# Patient Record
Sex: Female | Born: 1960 | Race: White | Hispanic: No | Marital: Married | Smoking: Former smoker
Health system: Southern US, Community
[De-identification: ages and names within clinical notes are randomized; demographics above are authoritative.]

## PROBLEM LIST (undated history)

## (undated) DIAGNOSIS — T7840XA Allergy, unspecified, initial encounter: Secondary | ICD-10-CM

## (undated) DIAGNOSIS — M1990 Unspecified osteoarthritis, unspecified site: Secondary | ICD-10-CM

## (undated) DIAGNOSIS — R011 Cardiac murmur, unspecified: Secondary | ICD-10-CM

## (undated) DIAGNOSIS — I10 Essential (primary) hypertension: Secondary | ICD-10-CM

## (undated) DIAGNOSIS — H269 Unspecified cataract: Secondary | ICD-10-CM

## (undated) DIAGNOSIS — J45909 Unspecified asthma, uncomplicated: Secondary | ICD-10-CM

## (undated) HISTORY — PX: TUBAL LIGATION: SHX77

## (undated) HISTORY — DX: Allergy, unspecified, initial encounter: T78.40XA

## (undated) HISTORY — DX: Essential (primary) hypertension: I10

## (undated) HISTORY — DX: Unspecified osteoarthritis, unspecified site: M19.90

## (undated) HISTORY — DX: Unspecified cataract: H26.9

## (undated) HISTORY — DX: Unspecified asthma, uncomplicated: J45.909

## (undated) HISTORY — DX: Cardiac murmur, unspecified: R01.1

---

## 2005-06-25 ENCOUNTER — Ambulatory Visit: Payer: Self-pay | Admitting: Family Medicine

## 2006-11-24 ENCOUNTER — Ambulatory Visit: Payer: Self-pay | Admitting: Family Medicine

## 2007-12-28 ENCOUNTER — Ambulatory Visit: Payer: Self-pay | Admitting: Family Medicine

## 2008-09-03 ENCOUNTER — Encounter: Payer: Self-pay | Admitting: Family Medicine

## 2008-09-05 ENCOUNTER — Encounter: Payer: Self-pay | Admitting: Family Medicine

## 2010-04-17 ENCOUNTER — Ambulatory Visit: Payer: Self-pay | Admitting: Family Medicine

## 2010-12-03 ENCOUNTER — Emergency Department: Payer: Self-pay | Admitting: Emergency Medicine

## 2012-09-11 ENCOUNTER — Ambulatory Visit: Payer: Self-pay | Admitting: Family Medicine

## 2012-09-11 LAB — CBC AND DIFFERENTIAL
HCT: 39 % (ref 36–46)
Hemoglobin: 12.9 g/dL (ref 12.0–16.0)
Neutrophils Absolute: 8 /uL
PLATELETS: 368 10*3/uL (ref 150–399)
WBC: 11.6 10*3/mL

## 2015-03-03 ENCOUNTER — Ambulatory Visit: Payer: Self-pay | Admitting: Family Medicine

## 2015-07-07 ENCOUNTER — Other Ambulatory Visit: Payer: Self-pay | Admitting: Family Medicine

## 2015-07-07 DIAGNOSIS — J4530 Mild persistent asthma, uncomplicated: Secondary | ICD-10-CM

## 2016-01-21 ENCOUNTER — Other Ambulatory Visit: Payer: Self-pay | Admitting: Family Medicine

## 2016-01-30 ENCOUNTER — Encounter: Payer: Self-pay | Admitting: Family Medicine

## 2016-01-30 ENCOUNTER — Ambulatory Visit (INDEPENDENT_AMBULATORY_CARE_PROVIDER_SITE_OTHER): Payer: BLUE CROSS/BLUE SHIELD | Admitting: Family Medicine

## 2016-01-30 VITALS — BP 120/50 | HR 98 | Temp 99.0°F | Resp 18 | Wt 146.0 lb

## 2016-01-30 DIAGNOSIS — J40 Bronchitis, not specified as acute or chronic: Secondary | ICD-10-CM

## 2016-01-30 DIAGNOSIS — J4530 Mild persistent asthma, uncomplicated: Secondary | ICD-10-CM | POA: Insufficient documentation

## 2016-01-30 DIAGNOSIS — R011 Cardiac murmur, unspecified: Secondary | ICD-10-CM | POA: Insufficient documentation

## 2016-01-30 DIAGNOSIS — D259 Leiomyoma of uterus, unspecified: Secondary | ICD-10-CM | POA: Insufficient documentation

## 2016-01-30 DIAGNOSIS — J302 Other seasonal allergic rhinitis: Secondary | ICD-10-CM | POA: Insufficient documentation

## 2016-01-30 MED ORDER — CEFDINIR 300 MG PO CAPS: 300.0000 mg | ORAL_CAPSULE | Freq: Two times a day (BID) | ORAL | Status: DC

## 2016-01-30 MED ORDER — PREDNISONE 5 MG PO TABS: 5.0000 mg | ORAL_TABLET | Freq: Every day | ORAL | Status: DC

## 2016-01-30 MED ORDER — ALBUTEROL SULFATE HFA 108 (90 BASE) MCG/ACT IN AERS: 2.0000 | INHALATION_SPRAY | Freq: Four times a day (QID) | RESPIRATORY_TRACT | Status: DC | PRN

## 2016-01-30 MED ORDER — BENZONATATE 200 MG PO CAPS: 200.0000 mg | ORAL_CAPSULE | Freq: Two times a day (BID) | ORAL | Status: DC | PRN

## 2016-01-30 NOTE — Patient Instructions (Signed)

## 2016-01-30 NOTE — Progress Notes (Signed)
Patient ID: Terri Cruz, female   DOB: 1961-01-31, 55 y.o.   MRN: [id number]       Patient: Terri Cruz Female    DOB: 1961-03-06   55 y.o.   MRN: [id number] Visit Date: 01/30/2016  Today's Provider: Vernie Murders, PA   Chief Complaint  Patient presents with  . Cough   Subjective:    HPI  Patient has not felt good since Saturday February 18th. Symptoms include back pain, fatigue, sleepy all the time, sweats at night, felt like she had a low grade fever, constant cough day and at night time-mainly dry at times some productivity is noted, hoarse and headache. She has taking Mucinnex, Delsym.  Patient Active Problem List   Diagnosis Date Noted  . Asthma, mild persistent 01/30/2016  . Cardiac murmur 01/30/2016  . Allergic rhinitis, seasonal 01/30/2016  . Leiomyoma of uterus 01/30/2016   Past Surgical History  Procedure Laterality Date  . Tubal ligation     Family History  Problem Relation Age of Onset  . Alcohol abuse Mother   . Emphysema Mother   . Leukemia Mother   . Liver cancer Mother   . Hypertension Father   . Stroke Father   . Breast cancer Sister   . Graves' disease Sister   . Breast cancer Paternal Aunt    Allergies  Allergen Reactions  . Codeine     headache  . Hydrocodone-Homatropine     Gi issues   Previous Medications   ALBUTEROL (VENTOLIN HFA) 108 (90 BASE) MCG/ACT INHALER    Inhale into the lungs.   MELOXICAM (MOBIC) 15 MG TABLET    Take by mouth.   SYMBICORT 160-4.5 MCG/ACT INHALER    INHALE 2 PUFFS BY MOUTH TWICE DAILY    Review of Systems  Constitutional: Positive for fever, chills and fatigue.  HENT: Positive for congestion, rhinorrhea and voice change.   Respiratory: Positive for cough.   Musculoskeletal: Positive for back pain and arthralgias.  Neurological: Positive for headaches.    Social History  Substance Use Topics  . Smoking status: Former Smoker -- 0.50 packs/day for 20 years    Types: Cigarettes    Quit date:  12/06/1997  . Smokeless tobacco: Never Used  . Alcohol Use: Yes     Comment: occasionall   Objective:   BP 120/50 mmHg  Pulse 98  Temp(Src) 99 F (37.2 C)  Resp 18  Wt 146 lb (66.225 kg)  SpO2 94%  Physical Exam  Constitutional: She is oriented to person, place, and time. She appears well-developed and well-nourished.  HENT:  Head: Normocephalic.  Right Ear: External ear normal.  Left Ear: External ear normal.  Nose: Nose normal.  Slightly injected posterior pharynx.  Eyes: Conjunctivae and EOM are normal.  Neck: Normal range of motion. Neck supple.  Cardiovascular: Normal rate, regular rhythm and normal heart sounds.   Pulmonary/Chest: Effort normal.  Slightly coarse breath sounds.  Abdominal: Soft. Bowel sounds are normal.  Neurological: She is alert and oriented to person, place, and time.      Assessment & Plan:     1. Bronchitis Onset with cough and some wheeze over the past week. Low grade fever today. Will treat with Omnicef and Tessalon Perles since she has severe headaches with any codeine derivatives. Increase fluid intake and may add Mucinex-DM prn. Recheck in 1 week if no better. - cefdinir (OMNICEF) 300 MG capsule; Take 1 capsule (300 mg total) by mouth 2 (two) times daily.  Dispense:  20 capsule; Refill: 0 - benzonatate (TESSALON) 200 MG capsule; Take 1 capsule (200 mg total) by mouth 2 (two) times daily as needed for cough.  Dispense: 20 capsule; Refill: 0  2. Asthma, mild persistent, uncomplicated Occasional wheeze. Has been better since restarting the Symbicort. Refill Ventolin-HFA and add prednisone taper. If wheezing or dyspnea worsens, may need to return to the clinic for a nebulizer treatment. - predniSONE (DELTASONE) 5 MG tablet; Take 1 tablet (5 mg total) by mouth daily with breakfast. Taper by one tablet each day (6,5,4,3,2,1).  Dispense: 21 tablet; Refill: 0 - albuterol (VENTOLIN HFA) 108 (90 Base) MCG/ACT inhaler; Inhale 2 puffs into the lungs every 6  (six) hours as needed for wheezing or shortness of breath.  Dispense: 18 g; Refill: [place], [place]  [facility]

## 2016-04-12 ENCOUNTER — Other Ambulatory Visit: Payer: Self-pay | Admitting: Family Medicine

## 2016-06-29 ENCOUNTER — Other Ambulatory Visit: Payer: Self-pay | Admitting: Family Medicine

## 2016-06-29 DIAGNOSIS — J4530 Mild persistent asthma, uncomplicated: Secondary | ICD-10-CM

## 2017-10-18 ENCOUNTER — Telehealth: Payer: Self-pay

## 2017-10-18 DIAGNOSIS — J4530 Mild persistent asthma, uncomplicated: Secondary | ICD-10-CM

## 2017-10-18 NOTE — Telephone Encounter (Signed)
Almost been 2 years since her last appointment. Need to schedule follow up to prescribe again.

## 2017-10-18 NOTE — Telephone Encounter (Signed)
[facility] is requesting a refill on Symbicort 160-4.5 mcg inhaler.

## 2017-10-19 NOTE — Telephone Encounter (Signed)
LMTCB to schedule a follow up appointment before refill can be approved.

## 2017-10-19 NOTE — Telephone Encounter (Signed)
Pt returned call and is scheduled for 10/28/17 @ 10:30 am. Thanks TNP

## 2017-10-20 MED ORDER — BUDESONIDE-FORMOTEROL FUMARATE 160-4.5 MCG/ACT IN AERO: 2.0000 | INHALATION_SPRAY | Freq: Two times a day (BID) | RESPIRATORY_TRACT | 0 refills | Status: DC

## 2017-10-20 NOTE — Telephone Encounter (Signed)
May send in one inhaler to the pharmacy.

## 2017-10-20 NOTE — Telephone Encounter (Signed)
[facility] sent to [facility]

## 2017-10-20 NOTE — Telephone Encounter (Signed)
May have a sample if needed before recheck appointment.

## 2017-10-28 ENCOUNTER — Ambulatory Visit: Payer: BLUE CROSS/BLUE SHIELD | Admitting: Family Medicine

## 2017-11-16 ENCOUNTER — Other Ambulatory Visit: Payer: Self-pay | Admitting: Family Medicine

## 2017-11-16 DIAGNOSIS — J4530 Mild persistent asthma, uncomplicated: Secondary | ICD-10-CM

## 2017-11-16 NOTE — Telephone Encounter (Signed)
[facility] [address] [facility]on faxed refill request for following medications: budesonide-formoterol (SYMBICORT) 160-4.5 MCG/ACT inhaler     Please advise,Thanks [place]

## 2017-11-17 MED ORDER — BUDESONIDE-FORMOTEROL FUMARATE 160-4.5 MCG/ACT IN AERO: 2.0000 | INHALATION_SPRAY | Freq: Two times a day (BID) | RESPIRATORY_TRACT | 0 refills | Status: DC

## 2017-12-23 ENCOUNTER — Other Ambulatory Visit: Payer: Self-pay | Admitting: Family Medicine

## 2017-12-23 DIAGNOSIS — J4530 Mild persistent asthma, uncomplicated: Secondary | ICD-10-CM

## 2018-03-06 ENCOUNTER — Ambulatory Visit (INDEPENDENT_AMBULATORY_CARE_PROVIDER_SITE_OTHER): Payer: BLUE CROSS/BLUE SHIELD

## 2018-03-06 ENCOUNTER — Encounter: Payer: Self-pay | Admitting: Podiatry

## 2018-03-06 ENCOUNTER — Ambulatory Visit (INDEPENDENT_AMBULATORY_CARE_PROVIDER_SITE_OTHER): Payer: BLUE CROSS/BLUE SHIELD | Admitting: Podiatry

## 2018-03-06 ENCOUNTER — Other Ambulatory Visit: Payer: Self-pay | Admitting: Podiatry

## 2018-03-06 VITALS — BP 182/91 | HR 92

## 2018-03-06 DIAGNOSIS — R52 Pain, unspecified: Secondary | ICD-10-CM

## 2018-03-06 DIAGNOSIS — M722 Plantar fascial fibromatosis: Secondary | ICD-10-CM

## 2018-03-06 MED ORDER — MELOXICAM 15 MG PO TABS: 15.0000 mg | ORAL_TABLET | Freq: Every day | ORAL | 0 refills | Status: DC

## 2018-03-06 NOTE — Progress Notes (Signed)
This patient presents the office with chief complaint of pain in the heel of her right foot.  She describes the pain as a nail driving through her heel.  She says this pain has been worsening for the last 4 weeks.  She has tried over-the-counter inserts, ice and even bought a new pair of shoes.  She says she experiences pain upon rising in the a.m. and standing from a sitting position.  She also says that she works for [employer] which requires significant amount of walking.  She says pain was so severe this weekend that she can only work a half day due to the pain in her heel/  She presents the office today for an evaluation and treatment of her painful right heel.   General Appearance  Alert, conversant and in no acute stress.  Vascular  Dorsalis pedis and posterior tibial  pulses are palpable  bilaterally.  Capillary return is within normal limits  bilaterally. Temperature is within normal limits  bilaterally.  Neurologic  Senn-Weinstein monofilament wire test within normal limits  bilaterally. Muscle power within normal limits bilaterally.  Nails Thick disfigured discolored nails with subungual debris  from hallux to fifth toes bilaterally. No evidence of bacterial infection or drainage bilaterally.    Orthopedic  No limitations of motion of motion feet .  No crepitus or effusions noted.  No bony pathology or digital deformities noted. Palpable pain at the insertion of the plantar fascia right foot.  Functional hallux limitus bilaterally  Skin  normotropic skin with no porokeratosis noted bilaterally.  No signs of infections or ulcers noted.     Plantar fascitis right heel  Initial exam.  X-rays taken do reveal calcification at the insertion of the plantar fascia right heel.  Discussed condition with this patient.  Told patient just perform stretching exercises and ice her heel.  Prescribed Mobic 15 mg, take 1 daily.  Injection therapy right heel.Injection therapy using 1.0 cc. Of 2% xylocaine(  20 mg.) plus 1 cc. of kenalog-la ( 10 mg) plus 1/2 cc. of dexamethazone phosphate ( 2 mg).  This patient has an adequate orthoses that she is wearing in her shoes.  I told this patient that she would benefit from  orthotic therapy.  Patient to be seen by [facility] for orthotic therapy.  RTC 2 weeks for follow up evaluation.   Gardiner Barefoot DPM

## 2018-03-08 ENCOUNTER — Ambulatory Visit (INDEPENDENT_AMBULATORY_CARE_PROVIDER_SITE_OTHER): Payer: BLUE CROSS/BLUE SHIELD | Admitting: Orthotics

## 2018-03-08 DIAGNOSIS — M722 Plantar fascial fibromatosis: Secondary | ICD-10-CM | POA: Diagnosis not present

## 2018-03-08 NOTE — Progress Notes (Signed)

## 2018-03-20 ENCOUNTER — Encounter: Payer: Self-pay | Admitting: Podiatry

## 2018-03-20 ENCOUNTER — Ambulatory Visit (INDEPENDENT_AMBULATORY_CARE_PROVIDER_SITE_OTHER): Payer: BLUE CROSS/BLUE SHIELD | Admitting: Podiatry

## 2018-03-20 DIAGNOSIS — M2021 Hallux rigidus, right foot: Secondary | ICD-10-CM

## 2018-03-20 DIAGNOSIS — M2022 Hallux rigidus, left foot: Secondary | ICD-10-CM | POA: Diagnosis not present

## 2018-03-20 DIAGNOSIS — M205X1 Other deformities of toe(s) (acquired), right foot: Secondary | ICD-10-CM

## 2018-03-20 DIAGNOSIS — M205X2 Other deformities of toe(s) (acquired), left foot: Secondary | ICD-10-CM

## 2018-03-20 DIAGNOSIS — M722 Plantar fascial fibromatosis: Secondary | ICD-10-CM

## 2018-03-20 NOTE — Progress Notes (Signed)
His patient presents the office for continued evaluation and treatment of her painful right heel.  She says that her foot is approximately 50% improved from her previous. She was diagnosed with plantar fasciitis on the right heel secondary to a hallux limitus.  She was treated with injection therapy as well as Mobic by mouth.  She was told to wear her temporary orthoses  and was seen by Liliane Channel and is acquiring a new pair of orthoses.  She is pleased with her improvement but still states she has pain by noon every day due to her work.  She presents the office today for continued evaluation and treatment of her painful right foot.   General Appearance  Alert, conversant and in no acute stress.  Vascular  Dorsalis pedis and posterior tibial  pulses are palpable  bilaterally.  Capillary return is within normal limits  bilaterally. Temperature is within normal limits  bilaterally.  Neurologic  Senn-Weinstein monofilament wire test within normal limits  bilaterally. Muscle power within normal limits bilaterally.  Nails Thick disfigured discolored nails with subungual debris  from hallux to fifth toes bilaterally. No evidence of bacterial infection or drainage bilaterally.  Orthopedic  No limitations of motion of motion feet .  No crepitus or effusions noted.  Palpable pain continues at the insertion of the plantar fascia right foot.  Hallux limitus first MPJ bilaterally  Skin  normotropic skin with no porokeratosis noted bilaterally.  No signs of infections or ulcers noted.    Plantar fasciitis secondary to hallux limitus  ROV.  Injection therapy.  Injection therapy using 1.0 cc. Of 2% xylocaine( 20 mg.) plus 1 cc. of kenalog-la ( 10 mg) plus 1/2 cc. of dexamethazone phosphate ( 2 mg).  Patient was told to continue taking Mobic.  Patient to make an appointment in this office for 4 weeks from today.  By that day she will have received her orthoses from [facility] and can be evaluated for foot pain.   Gardiner Barefoot DPM

## 2018-03-29 ENCOUNTER — Ambulatory Visit (INDEPENDENT_AMBULATORY_CARE_PROVIDER_SITE_OTHER): Payer: BLUE CROSS/BLUE SHIELD | Admitting: Orthotics

## 2018-03-29 DIAGNOSIS — M722 Plantar fascial fibromatosis: Secondary | ICD-10-CM

## 2018-03-29 DIAGNOSIS — M2022 Hallux rigidus, left foot: Principal | ICD-10-CM

## 2018-03-29 DIAGNOSIS — M205X2 Other deformities of toe(s) (acquired), left foot: Secondary | ICD-10-CM

## 2018-03-29 DIAGNOSIS — M2021 Hallux rigidus, right foot: Principal | ICD-10-CM

## 2018-03-29 NOTE — Progress Notes (Signed)
Needs to add morton's extension to foot orthotic to address arthritc first mpj b/l

## 2018-04-17 ENCOUNTER — Ambulatory Visit: Payer: BLUE CROSS/BLUE SHIELD | Admitting: Podiatry

## 2018-04-19 ENCOUNTER — Ambulatory Visit: Payer: BLUE CROSS/BLUE SHIELD | Admitting: Orthotics

## 2018-04-19 DIAGNOSIS — M722 Plantar fascial fibromatosis: Secondary | ICD-10-CM

## 2018-04-19 NOTE — Progress Notes (Signed)
Patient came in today to pick up custom made foot orthotics.  The goals were accomplished and the patient reported no dissatisfaction with said orthotics.  Patient was advised of breakin period and how to report any issues. 

## 2018-05-04 ENCOUNTER — Encounter: Payer: Self-pay | Admitting: Family Medicine

## 2018-05-04 ENCOUNTER — Ambulatory Visit (INDEPENDENT_AMBULATORY_CARE_PROVIDER_SITE_OTHER): Payer: BLUE CROSS/BLUE SHIELD | Admitting: Family Medicine

## 2018-05-04 ENCOUNTER — Ambulatory Visit
Admission: RE | Admit: 2018-05-04 | Discharge: 2018-05-04 | Disposition: A | Discharge status: Home / Self Care | Payer: BLUE CROSS/BLUE SHIELD | Source: Ambulatory Visit | Attending: Family Medicine | Admitting: Family Medicine

## 2018-05-04 VITALS — BP 138/60 | HR 84 | Temp 98.5°F | Ht 66.0 in | Wt 138.4 lb

## 2018-05-04 DIAGNOSIS — M7540 Impingement syndrome of unspecified shoulder: Secondary | ICD-10-CM | POA: Insufficient documentation

## 2018-05-04 DIAGNOSIS — J4530 Mild persistent asthma, uncomplicated: Secondary | ICD-10-CM

## 2018-05-04 DIAGNOSIS — R6883 Chills (without fever): Secondary | ICD-10-CM | POA: Diagnosis not present

## 2018-05-04 DIAGNOSIS — R062 Wheezing: Secondary | ICD-10-CM | POA: Diagnosis not present

## 2018-05-04 DIAGNOSIS — R05 Cough: Secondary | ICD-10-CM | POA: Diagnosis not present

## 2018-05-04 DIAGNOSIS — R0602 Shortness of breath: Secondary | ICD-10-CM | POA: Diagnosis not present

## 2018-05-04 DIAGNOSIS — R059 Cough, unspecified: Secondary | ICD-10-CM

## 2018-05-04 MED ORDER — BENZONATATE 200 MG PO CAPS: 200.0000 mg | ORAL_CAPSULE | Freq: Three times a day (TID) | ORAL | 0 refills | Status: DC | PRN

## 2018-05-04 MED ORDER — BUDESONIDE-FORMOTEROL FUMARATE 160-4.5 MCG/ACT IN AERO: INHALATION_SPRAY | RESPIRATORY_TRACT | 3 refills | Status: DC

## 2018-05-04 MED ORDER — ALBUTEROL SULFATE (2.5 MG/3ML) 0.083% IN NEBU: 2.5000 mg | INHALATION_SOLUTION | Freq: Once | RESPIRATORY_TRACT | Status: DC

## 2018-05-04 NOTE — Progress Notes (Signed)
Patient: Terri Cruz Female    DOB: September 25, 1961   57 y.o.   MRN: [id number] Visit Date: 05/04/2018  Today's Provider: Vernie Murders, PA   Chief Complaint  Patient presents with  . Cough   Subjective:    Cough  This is a new problem. Episode onset: 1 week ago. The problem has been gradually improving. The cough is productive of sputum. Associated symptoms include chills and shortness of breath. The symptoms are aggravated by lying down. She has tried OTC cough suppressant (Symbicort) for the symptoms. The treatment provided moderate relief. Her past medical history is significant for asthma.   History reviewed. No pertinent past medical history. Patient Active Problem List   Diagnosis Date Noted  . Impingement syndrome of shoulder region 05/04/2018  . Asthma, mild persistent 01/30/2016  . Cardiac murmur 01/30/2016  . Allergic rhinitis, seasonal 01/30/2016  . Leiomyoma of uterus 01/30/2016   Past Surgical History:  Procedure Laterality Date  . TUBAL LIGATION     Family History  Problem Relation Age of Onset  . Alcohol abuse Mother   . Emphysema Mother   . Leukemia Mother   . Liver cancer Mother   . Hypertension Father   . Stroke Father   . Breast cancer Sister   . Graves' disease Sister   . Breast cancer Paternal Aunt    Allergies  Allergen Reactions  . Codeine     headache  . Hydrocodone-Homatropine     Gi issues    Current Outpatient Medications:  .  albuterol (VENTOLIN HFA) 108 (90 Base) MCG/ACT inhaler, Inhale 2 puffs into the lungs every 6 (six) hours as needed for wheezing or shortness of breath., Disp: 18 g, Rfl: 2 .  meloxicam (MOBIC) 15 MG tablet, Take 1 tablet (15 mg total) by mouth daily., Disp: 30 tablet, Rfl: 0 .  SYMBICORT 160-4.5 MCG/ACT inhaler, TAKE 2 PUFFS BY MOUTH TWICE A DAY. MUST MAKE APPT BEFORE MORE REFILLS, Disp: 10.2 g, Rfl: 0  Review of Systems  Constitutional: Positive for chills.  Respiratory: Positive for cough and  shortness of breath.   Cardiovascular: Negative.    Social History   Tobacco Use  . Smoking status: Former Smoker    Packs/day: 0.50    Years: 20.00    Pack years: 10.00    Types: Cigarettes    Last attempt to quit: 12/06/1997    Years since quitting: 20.4  . Smokeless tobacco: Never Used  Substance Use Topics  . Alcohol use: Yes    Comment: occasionally   Objective:   BP 138/60 (BP Location: Right Arm, Patient Position: Sitting, Cuff Size: Normal)   Pulse 84   Temp 98.5 F (36.9 C) (Oral)   Ht 5\' 6"  (1.676 m)   Wt 138 lb 6.4 oz (62.8 kg)   SpO2 93%   BMI 22.34 kg/m   Physical Exam  Constitutional: She is oriented to person, place, and time. She appears well-developed and well-nourished. No distress.  HENT:  Head: Normocephalic and atraumatic.  Right Ear: Hearing normal.  Left Ear: Hearing normal.  Nose: Nose normal.  Eyes: Conjunctivae and lids are normal. Right eye exhibits no discharge. Left eye exhibits no discharge. No scleral icterus.  Neck: Neck supple.  Cardiovascular: Normal rate and regular rhythm.  Pulmonary/Chest: No respiratory distress.  Couple of wheezes in the left posterior lung base. No rales or rhonchi.   Abdominal: Soft. Bowel sounds are normal.  Musculoskeletal: Normal range of motion.  Neurological: She is alert and oriented to person, place, and time.  Skin: Skin is intact. No lesion and no rash noted.  Psychiatric: She has a normal mood and affect. Her speech is normal and behavior is normal. Thought content normal.      Assessment & Plan:     1. Cough Hacking recurrent cough with some clear to white sputum. Has chills initially but did not document any fever. May continue Mucinex-DM during the day and add Benzonatate for night time cough disrupting sleep. Check CXR tor rule out pneumonia. Recheck pending reports. - DG Chest 2 View - benzonatate (TESSALON) 200 MG capsule; Take 1 capsule (200 mg total) by mouth 3 (three) times daily as needed  for cough.  Dispense: 20 capsule; Refill: 0  2. Chills No documented fever at onset and feels chills are abating. Recheck CBC and CXR. - CBC with Differential/Platelet - DG Chest 2 View  3. Wheeze Slight wheeze in the left posterior lung, Treated with an Albuterol Nebulizer treatment and lungs sounded clearer. Will get CXR and CBC to rule out infection. - albuterol (PROVENTIL) (2.5 MG/3ML) 0.083% nebulizer solution 2.5 mg - DG Chest 2 View  4. Mild persistent asthma without complication Felt the Symbicort has been helping in the past and needs a refill. - budesonide-formoterol (SYMBICORT) 160-4.5 MCG/ACT inhaler; TAKE 2 PUFFS BY MOUTH TWICE A DAY.  Dispense: 10.2 g; Refill: [place], PA  [facility]

## 2018-05-05 ENCOUNTER — Telehealth: Payer: Self-pay | Admitting: Family Medicine

## 2018-05-05 DIAGNOSIS — J40 Bronchitis, not specified as acute or chronic: Secondary | ICD-10-CM

## 2018-05-05 LAB — CBC WITH DIFFERENTIAL/PLATELET
Basophils Absolute: 0 10*3/uL (ref 0.0–0.2)
Basos: 0 %
EOS (ABSOLUTE): 0 10*3/uL (ref 0.0–0.4)
EOS: 0 %
HEMATOCRIT: 38.5 % (ref 34.0–46.6)
Hemoglobin: 12.7 g/dL (ref 11.1–15.9)
IMMATURE GRANULOCYTES: 0 %
Immature Grans (Abs): 0 10*3/uL (ref 0.0–0.1)
Lymphocytes Absolute: 3.1 10*3/uL (ref 0.7–3.1)
Lymphs: 18 %
MCH: 30.2 pg (ref 26.6–33.0)
MCHC: 33 g/dL (ref 31.5–35.7)
MCV: 92 fL (ref 79–97)
MONOS ABS: 1.3 10*3/uL — AB (ref 0.1–0.9)
Monocytes: 7 %
Neutrophils Absolute: 12.8 10*3/uL — ABNORMAL HIGH (ref 1.4–7.0)
Neutrophils: 75 %
PLATELETS: 374 10*3/uL (ref 150–450)
RBC: 4.2 x10E6/uL (ref 3.77–5.28)
RDW: 14.1 % (ref 12.3–15.4)
WBC: 17.2 10*3/uL — ABNORMAL HIGH (ref 3.4–10.8)

## 2018-05-05 MED ORDER — PREDNISONE 5 MG (21) PO TBPK
ORAL_TABLET | ORAL | 0 refills | Status: DC
Start: 2018-05-05 — End: 2018-10-12

## 2018-05-05 MED ORDER — CEFDINIR 300 MG PO CAPS: 300.0000 mg | ORAL_CAPSULE | Freq: Two times a day (BID) | ORAL | 0 refills | Status: DC

## 2018-05-05 NOTE — Telephone Encounter (Signed)
pt called for her results form her xray chest and lab from yesterday.  Pt's call back is [phone]  Con Memos

## 2018-05-05 NOTE — Telephone Encounter (Signed)
-----   Message from Margo Common, [place] sent at 05/05/2018  8:01 AM EDT ----- Increase in WBC count indicates infection. Awaiting final x-ray report. Suspect bronchitis and need for Omnicef 300 mg BID #20 with Prednisone 5 mg 6 day taper (6,5,4,3,2,1) #21 tablets. Continue present inhalers and cough control medications as needed. Recheck in a week if no better.

## 2018-05-05 NOTE — Telephone Encounter (Signed)
Advised patient as below. Medication was sent into the pharmacy. Patient will call in 1 week to have CBC repeated.

## 2018-05-05 NOTE — Telephone Encounter (Signed)
Left message to call back. I have not sent in medication yet. Will not send in until I verify the pharmacy. Thanks!

## 2018-05-05 NOTE — Telephone Encounter (Signed)
-----   Message from Margo Common, [place] sent at 05/05/2018 11:10 AM EDT ----- Final chest x-ray report confirms bronchitis without pneumonia. Proceed with Omnicef, Prednisone, Mucinex-DM, Symbicort and Benzonatate for cough and congestion. Recheck CBC in a week to be sure infection clearing.

## 2018-09-25 ENCOUNTER — Ambulatory Visit: Payer: BLUE CROSS/BLUE SHIELD | Admitting: Podiatry

## 2018-10-12 ENCOUNTER — Ambulatory Visit (INDEPENDENT_AMBULATORY_CARE_PROVIDER_SITE_OTHER): Payer: BLUE CROSS/BLUE SHIELD | Admitting: Podiatry

## 2018-10-12 ENCOUNTER — Encounter: Payer: Self-pay | Admitting: Podiatry

## 2018-10-12 DIAGNOSIS — M205X2 Other deformities of toe(s) (acquired), left foot: Secondary | ICD-10-CM

## 2018-10-12 DIAGNOSIS — M2022 Hallux rigidus, left foot: Secondary | ICD-10-CM

## 2018-10-12 DIAGNOSIS — M2021 Hallux rigidus, right foot: Secondary | ICD-10-CM | POA: Diagnosis not present

## 2018-10-12 DIAGNOSIS — M722 Plantar fascial fibromatosis: Secondary | ICD-10-CM | POA: Diagnosis not present

## 2018-10-12 NOTE — Progress Notes (Signed)
His patient presents the office for continued evaluation and treatment of her painful right heel.  She says that her right heel is feeling much better and she is not experiencing pain in her right heel.  She says she is experiencing pain pain proximally  to the right forefoot.  She says that she has not been able to wear the orthoses which were obtained from [facility] due to pain in her heels and feet.  She presents the office today for reevaluation and brings her orthoses  into the office for me to check.   General Appearance  Alert, conversant and in no acute stress.  Vascular  Dorsalis pedis and posterior tibial  pulses are palpable  bilaterally.  Capillary return is within normal limits  bilaterally. Temperature is within normal limits  bilaterally.  Neurologic  Senn-Weinstein monofilament wire test within normal limits  bilaterally. Muscle power within normal limits bilaterally.  Nails normal nails noted with no evidence of bacterial or fungal infection  Orthopedic  No limitations of motion of motion feet .  No crepitus or effusions noted.  Palpable pain at the insertion of plantar fascia right heel has resolved.  .  Hallux limitus first MPJ bilaterally.  Palpable pain proximal to the right forefoot.  Skin  normotropic skin with no porokeratosis noted bilaterally.  No signs of infections or ulcers noted.    Plantar fasciitis secondary to hallux limitus  ROV.  Discussed this condition with this patient.  We are pleased with the reduction of pain in her heel.  She presented to the office with her orthoses and after I examined her orthoses I was not pleased with the structure of the orthoses and the fact that no kinetic wedge was ever applied to these orthoses as requested.  She proceeded to give me the orthoses and I will bring them to the [place] office and discuss this matter with Liliane Channel.  Patient will be called with an update when the outcome of my visit with Liliane Channel is done.   Gardiner Barefoot DPM

## 2018-10-17 ENCOUNTER — Telehealth: Payer: Self-pay | Admitting: Podiatry

## 2018-10-17 NOTE — Telephone Encounter (Signed)
Left message for pt to call me back to get scheduled to see [facility] tomorrow 11.13 in the [place] office. She is to be worked in per Dr Prudence Davidson and Liliane Channel for her orthotics causing pain.

## 2019-08-14 ENCOUNTER — Telehealth: Payer: Self-pay | Admitting: Podiatry

## 2019-08-14 MED ORDER — MELOXICAM 15 MG PO TABS: 15.0000 mg | ORAL_TABLET | Freq: Every day | ORAL | 0 refills | Status: DC

## 2019-08-14 NOTE — Addendum Note (Signed)
Addended by: Graceann Congress D on: 08/14/2019 05:17 PM   Modules accepted: Orders

## 2019-08-14 NOTE — Telephone Encounter (Signed)
Pt is requesting refill of Meloxicam. Uses [facility] pharmacy on [address].

## 2019-08-14 NOTE — Telephone Encounter (Signed)
Per Dr Prudence Davidson, ok to refill this time only.  Patient needs appt.   Script has been sent to pharmacy

## 2019-09-01 ENCOUNTER — Other Ambulatory Visit: Payer: Self-pay | Admitting: Podiatry

## 2019-09-03 NOTE — Telephone Encounter (Signed)
done

## 2019-09-16 ENCOUNTER — Other Ambulatory Visit: Payer: Self-pay | Admitting: Podiatry

## 2019-09-17 ENCOUNTER — Other Ambulatory Visit: Payer: Self-pay | Admitting: Podiatry

## 2019-09-18 ENCOUNTER — Telehealth: Payer: Self-pay | Admitting: Podiatry

## 2019-09-18 NOTE — Telephone Encounter (Signed)
Pt is requesting a refill of meloxicam. Uses [facility] pharmacy [phone]. States they have sent [place] requests for refills with no response.

## 2019-09-19 ENCOUNTER — Other Ambulatory Visit: Payer: Self-pay | Admitting: Podiatry

## 2019-09-19 NOTE — Telephone Encounter (Signed)
Called patient and informed that she needs an appointment before another  prescription can be filled for meloxicam and that a refill had been sent to pharmacy of her choice on 08/14/2019. She verbalized understanding and said that it was never picked up. Will call back to schedule f/u appt.

## 2019-09-19 NOTE — Telephone Encounter (Signed)
Pt following up on request for refill of meloxicam

## 2019-09-20 ENCOUNTER — Other Ambulatory Visit: Payer: Self-pay

## 2019-09-20 ENCOUNTER — Encounter: Payer: Self-pay | Admitting: Podiatry

## 2019-09-20 ENCOUNTER — Other Ambulatory Visit: Payer: Self-pay | Admitting: *Deleted

## 2019-09-20 ENCOUNTER — Ambulatory Visit (INDEPENDENT_AMBULATORY_CARE_PROVIDER_SITE_OTHER): Payer: BC Managed Care – PPO | Admitting: Podiatry

## 2019-09-20 DIAGNOSIS — M205X1 Other deformities of toe(s) (acquired), right foot: Secondary | ICD-10-CM

## 2019-09-20 DIAGNOSIS — M722 Plantar fascial fibromatosis: Secondary | ICD-10-CM | POA: Diagnosis not present

## 2019-09-20 DIAGNOSIS — M205X2 Other deformities of toe(s) (acquired), left foot: Secondary | ICD-10-CM

## 2019-09-20 MED ORDER — MELOXICAM 15 MG PO TABS: 15.0000 mg | ORAL_TABLET | Freq: Every day | ORAL | 1 refills | Status: DC

## 2019-09-20 NOTE — Progress Notes (Signed)
This patient presents to the office for continued follow-up treatment for her painful right heel.  Patient states that she was previously treated with injection therapy mild back and power step insoles.  She says that her heel is very improved but she desires to receive a new pair of power step insoles as well as prescription for Mobic.  She says that combination treatment provides her with relief and she does not have the severe pain in her right heel.  She presents the office today for continued evaluation and treatment of her right foot.  Vascular  Dorsalis pedis and posterior tibial pulses are palpable  B/L.  Capillary return  WNL.  Temperature gradient is  WNL.  Skin turgor  WNL  Sensorium  Senn Weinstein monofilament wire  WNL. Normal tactile sensation.  Nail Exam  Patient has normal nails with no evidence of bacterial or fungal infection.  Orthopedic  Exam  Muscle tone and muscle strength  WNL.  No limitations of motion feet  B/L.  No crepitus or joint effusion noted.  Foot type is unremarkable and digits show no abnormalities.  Functional hallux limitus 1st MPJ  B/L.  Minimal heel pain right foot.  Skin  No open lesions.  Normal skin texture and turgor.  Plantar fasciits right heel.    ROV.  Patient received a new pair of power step insoles.  A prescription for Mobic was called into the pharmacy for this patient.  There has been difficulty receiving this prescription since this was initially called in in September.  Patient was given Mobic 15 mg with 1 refill.  RTC prn   Gardiner Barefoot DPM

## 2019-09-24 MED ORDER — MELOXICAM 15 MG PO TABS: ORAL_TABLET | ORAL | 0 refills | Status: AC

## 2019-09-24 NOTE — Addendum Note (Signed)
Addended by: Clovis Riley E on: 09/24/2019 10:05 AM   Modules accepted: Orders

## 2019-09-27 ENCOUNTER — Ambulatory Visit: Payer: BLUE CROSS/BLUE SHIELD | Admitting: Podiatry

## 2019-11-15 DIAGNOSIS — M7541 Impingement syndrome of right shoulder: Secondary | ICD-10-CM | POA: Diagnosis not present

## 2019-11-15 DIAGNOSIS — M533 Sacrococcygeal disorders, not elsewhere classified: Secondary | ICD-10-CM | POA: Diagnosis not present

## 2019-12-24 DIAGNOSIS — M79676 Pain in unspecified toe(s): Secondary | ICD-10-CM

## 2020-02-09 DIAGNOSIS — R350 Frequency of micturition: Secondary | ICD-10-CM | POA: Diagnosis not present

## 2020-02-09 DIAGNOSIS — R109 Unspecified abdominal pain: Secondary | ICD-10-CM | POA: Diagnosis not present

## 2020-02-11 ENCOUNTER — Other Ambulatory Visit: Payer: Self-pay

## 2020-02-11 ENCOUNTER — Encounter: Payer: Self-pay | Admitting: Family Medicine

## 2020-02-11 ENCOUNTER — Ambulatory Visit (INDEPENDENT_AMBULATORY_CARE_PROVIDER_SITE_OTHER): Payer: BC Managed Care – PPO | Admitting: Family Medicine

## 2020-02-11 VITALS — BP 160/78 | HR 60 | Temp 96.9°F | Wt 154.0 lb

## 2020-02-11 DIAGNOSIS — R350 Frequency of micturition: Secondary | ICD-10-CM

## 2020-02-11 DIAGNOSIS — M545 Low back pain, unspecified: Secondary | ICD-10-CM

## 2020-02-11 DIAGNOSIS — R109 Unspecified abdominal pain: Secondary | ICD-10-CM | POA: Diagnosis not present

## 2020-02-11 MED ORDER — SULFAMETHOXAZOLE-TRIMETHOPRIM 800-160 MG PO TABS: 1.0000 | ORAL_TABLET | Freq: Two times a day (BID) | ORAL | 0 refills | Status: DC

## 2020-02-11 MED ORDER — CYCLOBENZAPRINE HCL 5 MG PO TABS: 5.0000 mg | ORAL_TABLET | Freq: Three times a day (TID) | ORAL | 0 refills | Status: DC | PRN

## 2020-02-11 NOTE — Progress Notes (Signed)
Terri Cruz  MRN: [id number] DOB: 01-20-1961  Subjective:  HPI   The patient is 59 year old female who presents with complaint of right lower sided pain.  She states she started having pain about 1 week ago.  She first thought it was a pulled muscle.  Then on Saturday, 2 days ago she was at work and felt a pop in her back.  This created more pain and caused her to be unable to walk.  She also reports that she has been urinating much more frequently.    She was seen at an [facility] and was given Tamsulosin and Ibuprofen with the thought that she is having a kidney stone.  She was told to see her PCP to get a scan as she has no history of ever having stones.  She reports she continues to have frequency (19 times this morning) however, has not seen any blood and there was not any blood on her UA Saturday at the [facility].  Patient has history of sciatica on the left which she states is now flared up because she is having to sleep on that side and her hip is effected by it.  She has no radiation of pain down either leg.  Patient Active Problem List   Diagnosis Date Noted  . Impingement syndrome of shoulder region 05/04/2018  . Asthma, mild persistent 01/30/2016  . Cardiac murmur 01/30/2016  . Allergic rhinitis, seasonal 01/30/2016  . Leiomyoma of uterus 01/30/2016   No past medical history on file.   Past Surgical History:  Procedure Laterality Date  . TUBAL LIGATION     Family History  Problem Relation Age of Onset  . Alcohol abuse Mother   . Emphysema Mother   . Leukemia Mother   . Liver cancer Mother   . Hypertension Father   . Stroke Father   . Breast cancer Sister   . Graves' disease Sister   . Breast cancer Paternal Aunt    Social History   Socioeconomic History  . Marital status: Married    Spouse name: Not on file  . Number of children: Not on file  . Years of education: Not on file  . Highest education level: Not on file  Occupational  History  . Not on file  Tobacco Use  . Smoking status: Former Smoker    Packs/day: 0.50    Years: 20.00    Pack years: 10.00    Types: Cigarettes    Quit date: 12/06/1997    Years since quitting: 22.1  . Smokeless tobacco: Never Used  Substance and Sexual Activity  . Alcohol use: Yes    Comment: occasionally  . Drug use: No  . Sexual activity: Not on file  Other Topics Concern  . Not on file  Social History Narrative  . Not on file   Social Determinants of Health   Financial Resource Strain:   . Difficulty of Paying Living Expenses: Not on file  Food Insecurity:   . Worried About Charity fundraiser in the Last Year: Not on file  . Ran Out of Food in the Last Year: Not on file  Transportation Needs:   . Lack of Transportation (Medical): Not on file  . Lack of Transportation (Non-Medical): Not on file  Physical Activity:   . Days of Exercise per Week: Not on file  . Minutes of Exercise per Session: Not on file  Stress:   . Feeling of Stress : Not  on file  Social Connections:   . Frequency of Communication with Friends and Family: Not on file  . Frequency of Social Gatherings with Friends and Family: Not on file  . Attends Religious Services: Not on file  . Active Member of Clubs or Organizations: Not on file  . Attends Archivist Meetings: Not on file  . Marital Status: Not on file  Intimate Partner Violence:   . Fear of Current or Ex-Partner: Not on file  . Emotionally Abused: Not on file  . Physically Abused: Not on file  . Sexually Abused: Not on file   Outpatient Encounter Medications as of 02/11/2020  Medication Sig  . meloxicam (MOBIC) 15 MG tablet TAKE 1 TABLET BY MOUTH ONCE DAILY  . budesonide-formoterol (SYMBICORT) 160-4.5 MCG/ACT inhaler TAKE 2 PUFFS BY MOUTH TWICE A DAY. (Patient not taking: Reported on 02/11/2020)  . cefdinir (OMNICEF) 300 MG capsule Take 1 capsule (300 mg total) by mouth 2 (two) times daily. (Patient not taking: Reported on  02/11/2020)  . penicillin v potassium (VEETID) 500 MG tablet penicillin V potassium 500 mg tablet  . predniSONE (DELTASONE) 20 MG tablet prednisone 20 mg tablet  . predniSONE (DELTASONE) 5 MG tablet prednisone 5 mg tablets in a dose pack  . UNABLE TO FIND Symbicort 160 mcg-4.5 mcg/actuation HFA aerosol inhaler  . [DISCONTINUED] albuterol (VENTOLIN HFA) 108 (90 Base) MCG/ACT inhaler Ventolin HFA 90 mcg/actuation aerosol inhaler  . [DISCONTINUED] benzonatate (TESSALON) 200 MG capsule benzonatate 200 mg capsule   Facility-Administered Encounter Medications as of 02/11/2020  Medication  . albuterol (PROVENTIL) (2.5 MG/3ML) 0.083% nebulizer solution 2.5 mg   Allergies  Allergen Reactions  . Codeine     headache  . Hydrocodone-Homatropine     Gi issues   Review of Systems  Constitutional: Positive for chills (last night). Negative for diaphoresis, fever and malaise/fatigue.  Genitourinary: Positive for flank pain, frequency and urgency. Negative for dysuria and hematuria.  Musculoskeletal: Positive for back pain.    Objective:  BP (!) 160/78 (BP Location: Right Arm, Patient Position: Sitting, Cuff Size: Normal)   Pulse 60   Temp (!) 96.9 F (36.1 C) (Skin)   Wt 154 lb (69.9 kg)   SpO2 99%   BMI 24.86 kg/m   Physical Exam  Constitutional: She is oriented to person, place, and time and well-developed, well-nourished, and in no distress.  HENT:  Head: Normocephalic.  Eyes: Conjunctivae are normal.  Cardiovascular: Normal rate.  Pulmonary/Chest: Effort normal and breath sounds normal.  Abdominal: Soft. Bowel sounds are normal.  Urge to urinate with pressure over bladder. No CVA tenderness to percuss posteriorly.   Musculoskeletal:        General: No tenderness. Normal range of motion.     Cervical back: Neck supple.     Comments: SLR's 90 degrees without pain. No muscle weakness. Fair ROM. Some increase in right lower back discomfort to rotate torso to the right, flex laterally to the  right or hyperextend back.  Neurological: She is alert and oriented to person, place, and time. She has normal reflexes.  Skin: No rash noted.  Psychiatric: Mood, affect and judgment normal.    Assessment and Plan :   1. Right flank pain Onset over the past week without nausea, vomiting or hematuria. Urgent [facility] Clinic recommended follow up and possible imaging to rule out a kidney stone. No hematuria or pain to urinate. Will given muscle relaxant as she feels it may have been a muscle "pull". Urinalysis today  shows some bacteria. Schedule labs with C&S and renal ultrasound. Continue to drink plenty of fluids and may stop Tamsulosin because of side effects (stomach upset with "shakes"/chills). Follow up pending reports. - CBC with Differential/Platelet - Comprehensive metabolic panel - US Renal - Urine Culture - cyclobenzaprine (FLEXERIL) 5 MG tablet; Take 1 tablet (5 mg total) by mouth 3 (three) times daily as needed for muscle spasms.  Dispense: 21 tablet; Refill: 0  2. Urinary frequency Frequent urination without hematuria or burning/stinging over the past week. Wa evaluated at an Urgent Care Clinic couple days ago after having a "pop" sensation int he right back. Urinalysis at that time was concentrated and acidic (pH 5.0, sp.gr. 1.030) without RBC's or leukocytes. Urinalysis today showed occasional WBC with few bacteria. Has been drinking more fluids and urinating up to "19" times a day. Will give antibiotic and start C&S. - CBC with Differential/Platelet - Comprehensive metabolic panel - Urine Culture - sulfamethoxazole-trimethoprim (BACTRIM DS) 800-160 MG tablet; Take 1 tablet by mouth 2 (two) times daily.  Dispense: 14 tablet; Refill: 0  3. Right-sided low back pain without sciatica, unspecified chronicity No tenderness to palpation. No radiation of discomfort described as an ache worse in the lumbar paravertebral area and increased with movement. May apply moist heat and use Flexeril  5 mg HS or TID prn. Continue Ibuprofen 200 mg 2-4 tablets TID with meals prn. Recheck pending lab reports. - cyclobenzaprine (FLEXERIL) 5 MG tablet; Take 1 tablet (5 mg total) by mouth 3 (three) times daily as needed for muscle spasms.  Dispense: 21 tablet; Refill: 0

## 2020-02-12 ENCOUNTER — Telehealth: Payer: Self-pay

## 2020-02-12 DIAGNOSIS — R109 Unspecified abdominal pain: Secondary | ICD-10-CM | POA: Diagnosis not present

## 2020-02-12 DIAGNOSIS — R350 Frequency of micturition: Secondary | ICD-10-CM | POA: Diagnosis not present

## 2020-02-12 LAB — COMPREHENSIVE METABOLIC PANEL
ALT: 15 IU/L (ref 0–32)
AST: 19 IU/L (ref 0–40)
Albumin/Globulin Ratio: 2 (ref 1.2–2.2)
Albumin: 4.7 g/dL (ref 3.8–4.9)
Alkaline Phosphatase: 70 IU/L (ref 39–117)
BUN/Creatinine Ratio: 16 (ref 12–28)
BUN: 10 mg/dL (ref 8–27)
Bilirubin Total: 0.8 mg/dL (ref 0.0–1.2)
CO2: 24 mmol/L (ref 20–29)
Calcium: 9.9 mg/dL (ref 8.7–10.3)
Chloride: 100 mmol/L (ref 96–106)
Creatinine, Ser: 0.64 mg/dL (ref 0.57–1.00)
GFR calc Af Amer: 112 mL/min/{1.73_m2} (ref >59)
GFR calc non Af Amer: 97 mL/min/{1.73_m2} (ref >59)
Globulin, Total: 2.4 g/dL (ref 1.5–4.5)
Glucose: 102 mg/dL — ABNORMAL HIGH (ref 65–99)
Potassium: 4.6 mmol/L (ref 3.5–5.2)
Sodium: 140 mmol/L (ref 134–144)
Total Protein: 7.1 g/dL (ref 6.0–8.5)

## 2020-02-12 LAB — CBC WITH DIFFERENTIAL/PLATELET
Basophils Absolute: 0 10*3/uL (ref 0.0–0.2)
Basos: 1 %
EOS (ABSOLUTE): 0.1 10*3/uL (ref 0.0–0.4)
Eos: 1 %
Hematocrit: 36.9 % (ref 34.0–46.6)
Hemoglobin: 12.1 g/dL (ref 11.1–15.9)
Immature Grans (Abs): 0 10*3/uL (ref 0.0–0.1)
Immature Granulocytes: 0 %
Lymphocytes Absolute: 2.5 10*3/uL (ref 0.7–3.1)
Lymphs: 37 %
MCH: 30.2 pg (ref 26.6–33.0)
MCHC: 32.8 g/dL (ref 31.5–35.7)
MCV: 92 fL (ref 79–97)
Monocytes Absolute: 0.7 10*3/uL (ref 0.1–0.9)
Monocytes: 10 %
Neutrophils Absolute: 3.6 10*3/uL (ref 1.4–7.0)
Neutrophils: 51 %
Platelets: 351 10*3/uL (ref 150–450)
RBC: 4.01 x10E6/uL (ref 3.77–5.28)
RDW: 13.2 % (ref 11.7–15.4)
WBC: 6.8 10*3/uL (ref 3.4–10.8)

## 2020-02-12 NOTE — Telephone Encounter (Signed)
Pt advised of lab results.   Thanks,   -Loyola Santino  

## 2020-02-12 NOTE — Telephone Encounter (Signed)
-----   Message from Margo Common, [place] sent at 02/12/2020  2:30 PM EST ----- Blood tests essentially normal. Proceed with antibiotic given and recheck pending culture report.

## 2020-02-12 NOTE — Telephone Encounter (Signed)
Copied from [place] [id number]. Topic: General - Inquiry >> Feb 12, 2020  3:33 PM Virl Axe D wrote: Reason for CRM: Pt would like to have someone go over recent lab results sent to [facility]. She does not know how to interpret. No answer in FC line. Please return call.

## 2020-02-14 ENCOUNTER — Telehealth: Payer: Self-pay

## 2020-02-14 LAB — URINE CULTURE

## 2020-02-14 NOTE — Telephone Encounter (Signed)
-----   Message from Margo Common, [place] sent at 02/14/2020  9:17 AM EST ----- Final culture report showed low colony counts of bacteria and no specific bacteria identified. This may be due to the increase in fluid and use of the Tamsulosin that helped flush out the urinary tract system. Finish the antibiotics given and proceed with the renal ultrasound to rule out any stones.

## 2020-02-14 NOTE — Telephone Encounter (Signed)
Patient advised.

## 2020-02-14 NOTE — Telephone Encounter (Signed)
Patient advised. Patient states she is feeling much better. She states [facility] called yesterday to pre-register and she told them she did not need the [place]. Patient states if Simona Huh believe she should reschedule the [place] she will. Please contact patient and advise her of Dennis's recommendations.

## 2020-02-14 NOTE — Telephone Encounter (Signed)
If muscle relaxer worked that well, won't need the ultrasound at this time. Recheck as needed.

## 2020-02-15 ENCOUNTER — Ambulatory Visit: Payer: BC Managed Care – PPO

## 2020-03-05 NOTE — Progress Notes (Deleted)
       Patient: Terri Cruz Female    DOB: [birth year]   59 y.o.   MRN: [id number] Visit Date: 03/05/2020  Today's Provider: Vernie Murders, PA   No chief complaint on file.  Subjective:     Rash This is a new problem.    Allergies  Allergen Reactions  . Codeine     headache  . Hydrocodone-Homatropine     Gi issues     Current Outpatient Medications:  .  budesonide-formoterol (SYMBICORT) 160-4.5 MCG/ACT inhaler, TAKE 2 PUFFS BY MOUTH TWICE A DAY. (Patient not taking: Reported on 02/11/2020), Disp: 10.2 g, Rfl: 3 .  cefdinir (OMNICEF) 300 MG capsule, Take 1 capsule (300 mg total) by mouth 2 (two) times daily. (Patient not taking: Reported on 02/11/2020), Disp: 20 capsule, Rfl: 0 .  cyclobenzaprine (FLEXERIL) 5 MG tablet, Take 1 tablet (5 mg total) by mouth 3 (three) times daily as needed for muscle spasms., Disp: 21 tablet, Rfl: 0 .  meloxicam (MOBIC) 15 MG tablet, TAKE 1 TABLET BY MOUTH ONCE DAILY, Disp: 30 tablet, Rfl: 0 .  penicillin v potassium (VEETID) 500 MG tablet, penicillin V potassium 500 mg tablet, Disp: , Rfl:  .  predniSONE (DELTASONE) 20 MG tablet, prednisone 20 mg tablet, Disp: , Rfl:  .  predniSONE (DELTASONE) 5 MG tablet, prednisone 5 mg tablets in a dose pack, Disp: , Rfl:  .  sulfamethoxazole-trimethoprim (BACTRIM DS) 800-160 MG tablet, Take 1 tablet by mouth 2 (two) times daily., Disp: 14 tablet, Rfl: 0 .  UNABLE TO FIND, Symbicort 160 mcg-4.5 mcg/actuation HFA aerosol inhaler, Disp: , Rfl:   Current Facility-Administered Medications:  .  albuterol (PROVENTIL) (2.5 MG/3ML) 0.083% nebulizer solution 2.5 mg, 2.5 mg, Nebulization, Once, Chrismon, Dennis E, PA  Review of Systems  Skin: Positive for rash.    Social History   Tobacco Use  . Smoking status: Former Smoker    Packs/day: 0.50    Years: 20.00    Pack years: 10.00    Types: Cigarettes    Quit date: 12/06/1997    Years since quitting: 22.2  . Smokeless tobacco: Never Used  Substance Use  Topics  . Alcohol use: Yes    Comment: occasionally      Objective:   LMP 02/11/2015  There were no vitals filed for this visit.There is no height or weight on file to calculate BMI.   Physical Exam   No results found for any visits on 03/06/20.     Assessment & [place], PA  [facility]

## 2020-03-06 ENCOUNTER — Ambulatory Visit: Payer: BC Managed Care – PPO | Admitting: Family Medicine

## 2020-04-02 DIAGNOSIS — M7541 Impingement syndrome of right shoulder: Secondary | ICD-10-CM | POA: Diagnosis not present

## 2020-10-09 DIAGNOSIS — M7541 Impingement syndrome of right shoulder: Secondary | ICD-10-CM | POA: Diagnosis not present

## 2020-10-28 ENCOUNTER — Encounter: Payer: Self-pay | Admitting: Physician Assistant

## 2020-10-28 ENCOUNTER — Ambulatory Visit
Admission: RE | Admit: 2020-10-28 | Discharge: 2020-10-28 | Disposition: A | Discharge status: Home / Self Care | Payer: BC Managed Care – PPO | Attending: Physician Assistant | Admitting: Physician Assistant

## 2020-10-28 ENCOUNTER — Other Ambulatory Visit: Payer: Self-pay

## 2020-10-28 ENCOUNTER — Ambulatory Visit (INDEPENDENT_AMBULATORY_CARE_PROVIDER_SITE_OTHER): Payer: BC Managed Care – PPO | Admitting: Physician Assistant

## 2020-10-28 ENCOUNTER — Ambulatory Visit
Admission: RE | Admit: 2020-10-28 | Discharge: 2020-10-28 | Disposition: A | Discharge status: Home / Self Care | Payer: BC Managed Care – PPO | Source: Ambulatory Visit | Attending: Physician Assistant | Admitting: Physician Assistant

## 2020-10-28 VITALS — BP 158/61 | HR 71 | Temp 98.2°F | Wt 163.4 lb

## 2020-10-28 DIAGNOSIS — M25559 Pain in unspecified hip: Secondary | ICD-10-CM | POA: Diagnosis not present

## 2020-10-28 DIAGNOSIS — M25551 Pain in right hip: Secondary | ICD-10-CM | POA: Diagnosis not present

## 2020-10-28 LAB — POCT URINALYSIS DIPSTICK
Bilirubin, UA: NEGATIVE
Blood, UA: NEGATIVE
Glucose, UA: NEGATIVE
Ketones, UA: NEGATIVE
Leukocytes, UA: NEGATIVE
Nitrite, UA: NEGATIVE
Protein, UA: NEGATIVE
Spec Grav, UA: 1.01 (ref 1.010–1.025)
Urobilinogen, UA: 0.2 E.U./dL
pH, UA: 6.5 (ref 5.0–8.0)

## 2020-10-28 MED ORDER — PREDNISONE 10 MG PO TABS: ORAL_TABLET | ORAL | 0 refills | Status: DC

## 2020-10-28 NOTE — Patient Instructions (Signed)
Hip Pain The hip is the joint between the upper legs and the lower pelvis. The bones, cartilage, tendons, and muscles of your hip joint support your body and allow you to move around. Hip pain can range from a minor ache to severe pain in one or both of your hips. The pain may be felt on the inside of the hip joint near the groin, or on the outside near the buttocks and upper thigh. You may also have swelling or stiffness in your hip area. Follow these instructions at home: Managing pain, stiffness, and swelling      If directed, put ice on the painful area. To do this: ? Put ice in a plastic bag. ? Place a towel between your skin and the bag. ? Leave the ice on for 20 minutes, 2-3 times a day.  If directed, apply heat to the affected area as often as told by your health care provider. Use the heat source that your health care provider recommends, such as a moist heat pack or a heating pad. ? Place a towel between your skin and the heat source. ? Leave the heat on for 20-30 minutes. ? Remove the heat if your skin turns bright red. This is especially important if you are unable to feel pain, heat, or cold. You may have a greater risk of getting burned. Activity  Do exercises as told by your health care provider.  Avoid activities that cause pain. General instructions   Take over-the-counter and prescription medicines only as told by your health care provider.  Keep a journal of your symptoms. Write down: ? How often you have hip pain. ? The location of your pain. ? What the pain feels like. ? What makes the pain worse.  Sleep with a pillow between your legs on your most comfortable side.  Keep all follow-up visits as told by your health care provider. This is important. Contact a health care provider if:  You cannot put weight on your leg.  Your pain or swelling continues or gets worse after one week.  It gets harder to walk.  You have a fever. Get help right away  if:  You fall.  You have a sudden increase in pain and swelling in your hip.  Your hip is red or swollen or very tender to touch. Summary  Hip pain can range from a minor ache to severe pain in one or both of your hips.  The pain may be felt on the inside of the hip joint near the groin, or on the outside near the buttocks and upper thigh.  Avoid activities that cause pain.  Write down how often you have hip pain, the location of the pain, what makes it worse, and what it feels like. This information is not intended to replace advice given to you by your health care provider. Make sure you discuss any questions you have with your health care provider. Document Revised: 04/09/2019 Document Reviewed: 04/09/2019 Elsevier Patient Education  2020 Elsevier Inc[employer] -- 

## 2020-10-28 NOTE — Progress Notes (Signed)
Established patient visit   Patient: Terri Cruz   DOB: Jun 20, 1961   59 y.o. Female  MRN: [id number] Visit Date: 10/28/2020  Today's healthcare provider: Trinna Post, PA-C   Chief Complaint  Patient presents with  . Hip Pain  I,Tomy Khim M Caden Fatica,acting as a scribe for Trinna Post, PA-C.,have documented all relevant documentation on the behalf of Trinna Post, PA-C,as directed by  Trinna Post, PA-C while in the presence of Trinna Post, PA-C.  Subjective    Hip Pain  The incident occurred 3 to 5 days ago. There was no injury mechanism. The pain is present in the right hip. The quality of the pain is described as aching. The pain has been constant since onset. Pertinent negatives include no inability to bear weight, numbness or tingling. She reports no foreign bodies present. The symptoms are aggravated by movement and weight bearing. She has tried nothing for the symptoms. The treatment provided no relief.  She states that she had the same pain 6 months ago and was diagnosed with a kidney infections. She reports she has had this pain since the beginning of this year. It is worse when she walks. She has had steroid hip injections of left hip with good relief. She is now having right hip pain in a somewhat similar fashion. She does have her reproductive organs. LMP 7 years ago. No vaginal discharge or pelvic pain. No numbness or tingling in her legs. She is not having any weakness in her lower extremities. Her job is very physical and she stands 8-9 hours per day. She reports worsening pain in her right hip after sitting which does ease off after she walks.      Medications: Outpatient Medications Prior to Visit  Medication Sig  . cyclobenzaprine (FLEXERIL) 5 MG tablet Take 1 tablet (5 mg total) by mouth 3 (three) times daily as needed for muscle spasms.  . meloxicam (MOBIC) 15 MG tablet TAKE 1 TABLET BY MOUTH ONCE DAILY  . budesonide-formoterol (SYMBICORT)  160-4.5 MCG/ACT inhaler TAKE 2 PUFFS BY MOUTH TWICE A DAY. (Patient not taking: Reported on 02/11/2020)  . cefdinir (OMNICEF) 300 MG capsule Take 1 capsule (300 mg total) by mouth 2 (two) times daily. (Patient not taking: Reported on 02/11/2020)  . penicillin v potassium (VEETID) 500 MG tablet penicillin V potassium 500 mg tablet (Patient not taking: Reported on 10/28/2020)  . UNABLE TO FIND Symbicort 160 mcg-4.5 mcg/actuation HFA aerosol inhaler (Patient not taking: Reported on 10/28/2020)  . [DISCONTINUED] predniSONE (DELTASONE) 20 MG tablet prednisone 20 mg tablet (Patient not taking: Reported on 10/28/2020)  . [DISCONTINUED] predniSONE (DELTASONE) 5 MG tablet prednisone 5 mg tablets in a dose pack (Patient not taking: Reported on 10/28/2020)  . [DISCONTINUED] sulfamethoxazole-trimethoprim (BACTRIM DS) 800-160 MG tablet Take 1 tablet by mouth 2 (two) times daily.   Facility-Administered Medications Prior to Visit  Medication Dose Route Frequency Provider  . albuterol (PROVENTIL) (2.5 MG/3ML) 0.083% nebulizer solution 2.5 mg  2.5 mg Nebulization Once Chrismon, Vickki Muff, PA    Review of Systems  Gastrointestinal: Negative for abdominal pain.  Genitourinary: Negative for decreased urine volume, dysuria, flank pain, frequency, urgency, vaginal bleeding and vaginal discharge.  Musculoskeletal: Negative for back pain.  Neurological: Negative for tingling and numbness.      Objective    BP (!) 158/61 (BP Location: Left Arm, Patient Position: Sitting, Cuff Size: Large)   Pulse 71   Temp 98.2 F (36.8 C) (Oral)  Wt 163 lb 6.4 oz (74.1 kg)   LMP 02/11/2015   SpO2 100%   BMI 26.37 kg/m    Physical Exam Constitutional:      Appearance: Normal appearance.  Cardiovascular:     Rate and Rhythm: Normal rate and regular rhythm.     Heart sounds: Normal heart sounds.  Pulmonary:     Effort: Pulmonary effort is normal.     Breath sounds: Normal breath sounds.  Abdominal:     General: Bowel  sounds are normal.     Palpations: Abdomen is soft.  Musculoskeletal:     Right hip: Normal.     Left hip: Normal.  Skin:    General: Skin is warm and dry.  Neurological:     Mental Status: She is alert and oriented to person, place, and time. Mental status is at baseline.  Psychiatric:        Mood and Affect: Mood normal.        Behavior: Behavior normal.       Results for orders placed or performed in visit on 10/28/20  POCT urinalysis dipstick  Result Value Ref Range   Color, UA Yellow    Clarity, UA Clear    Glucose, UA Negative Negative   Bilirubin, UA Negative    Ketones, UA Negative    Spec Grav, UA 1.010 1.010 - 1.025   Blood, UA Negative    pH, UA 6.5 5.0 - 8.0   Protein, UA Negative Negative   Urobilinogen, UA 0.2 0.2 or 1.0 E.U./dL   Nitrite, UA Negative    Leukocytes, UA Negative Negative   Appearance     Odor      Assessment & Plan    1. Hip pain  - POCT urinalysis dipstick - predniSONE (DELTASONE) 10 MG tablet; Take 6 pills on day 1, 5 pills on day 2 and so on until complete.  Dispense: 21 tablet; Refill: 0 - DG Hip Unilat W OR W/O Pelvis 2-3 Views Right; Future  2. Right hip pain  - predniSONE (DELTASONE) 10 MG tablet; Take 6 pills on day 1, 5 pills on day 2 and so on until complete.  Dispense: 21 tablet; Refill: 0 - DG Hip Unilat W OR W/O Pelvis 2-3 Views Right; Future   Return if symptoms worsen or fail to improve.      ITrinna Post, PA-C, have reviewed all documentation for this visit. The documentation on 10/28/20 for the exam, diagnosis, procedures, and orders are all accurate and complete.    Paulene Floor  [facility] [phone] (phone) [phone] (fax)  [place]

## 2021-01-08 DIAGNOSIS — M722 Plantar fascial fibromatosis: Secondary | ICD-10-CM

## 2021-02-03 ENCOUNTER — Ambulatory Visit: Payer: BC Managed Care – PPO | Admitting: Dermatology

## 2021-08-31 ENCOUNTER — Other Ambulatory Visit: Payer: Self-pay

## 2021-08-31 ENCOUNTER — Ambulatory Visit (INDEPENDENT_AMBULATORY_CARE_PROVIDER_SITE_OTHER): Payer: BC Managed Care – PPO | Admitting: Dermatology

## 2021-08-31 DIAGNOSIS — L821 Other seborrheic keratosis: Secondary | ICD-10-CM

## 2021-08-31 DIAGNOSIS — L719 Rosacea, unspecified: Secondary | ICD-10-CM | POA: Diagnosis not present

## 2021-08-31 DIAGNOSIS — D1801 Hemangioma of skin and subcutaneous tissue: Secondary | ICD-10-CM | POA: Diagnosis not present

## 2021-08-31 NOTE — Patient Instructions (Signed)

## 2021-08-31 NOTE — Progress Notes (Signed)
   Follow-Up Visit   Subjective  Terri Cruz is a 60 y.o. female who presents for the following: Skin Problem (Patient here today for 2 spots at her face. Present for 1 - 1 1/2 years, looked like blackheads when they first appeared, now they look like dark moles. She also has a spot at right that had been itching. ).  Patient does not have hx of skin cancer.   The following portions of the chart were reviewed this encounter and updated as appropriate:   Tobacco  Allergies  Meds  Problems  Med Hx  Surg Hx  Fam Hx     Review of Systems:  No other skin or systemic complaints except as noted in HPI or Assessment and Plan.  Objective  Well appearing patient in no apparent distress; mood and affect are within normal limits.  A focused examination was performed including face, neck, chest. Relevant physical exam findings are noted in the Assessment and Plan.  face Dilated vessels  right face Red papules.         Assessment & Plan  Rosacea face Rosacea is a chronic progressive skin condition usually affecting the face of adults, causing redness and/or acne bumps. It is treatable but not curable. It sometimes affects the eyes (ocular rosacea) as well. It may respond to topical and/or systemic medication and can flare with stress, sun exposure, alcohol, exercise and some foods.  Daily application of broad spectrum spf 30+ sunscreen to face is recommended to reduce flares.  Hemangioma of skin right face Dermoscopy exam most consistent with vascular lesions/hemangiomas Discussed laser if patient would like to treat  Seborrheic Keratoses - Stuck-on, waxy, tan-brown papules and/or plaques  - Benign-appearing - Discussed benign etiology and prognosis. - Observe - Call for any changes  Return if symptoms worsen or fail to improve.  Graciella Belton, RMA, am acting as scribe for Sarina Ser, MD . Documentation: I have reviewed the above documentation for accuracy and  completeness, and I agree with the above.  Sarina Ser, MD

## 2021-09-03 ENCOUNTER — Encounter: Payer: Self-pay | Admitting: Dermatology

## 2021-10-14 ENCOUNTER — Ambulatory Visit (INDEPENDENT_AMBULATORY_CARE_PROVIDER_SITE_OTHER): Payer: BC Managed Care – PPO

## 2021-10-14 ENCOUNTER — Other Ambulatory Visit: Payer: Self-pay

## 2021-10-14 DIAGNOSIS — Z23 Encounter for immunization: Secondary | ICD-10-CM | POA: Diagnosis not present

## 2021-10-21 ENCOUNTER — Ambulatory Visit: Payer: BC Managed Care – PPO

## 2021-12-09 ENCOUNTER — Telehealth (INDEPENDENT_AMBULATORY_CARE_PROVIDER_SITE_OTHER): Payer: BC Managed Care – PPO | Admitting: Physician Assistant

## 2021-12-09 ENCOUNTER — Encounter: Payer: Self-pay | Admitting: Physician Assistant

## 2021-12-09 DIAGNOSIS — J209 Acute bronchitis, unspecified: Secondary | ICD-10-CM

## 2021-12-09 DIAGNOSIS — J4531 Mild persistent asthma with (acute) exacerbation: Secondary | ICD-10-CM | POA: Diagnosis not present

## 2021-12-09 MED ORDER — ALBUTEROL SULFATE HFA 108 (90 BASE) MCG/ACT IN AERS: 2.0000 | INHALATION_SPRAY | Freq: Four times a day (QID) | RESPIRATORY_TRACT | 2 refills | Status: DC | PRN

## 2021-12-09 NOTE — Assessment & Plan Note (Signed)
Per patient history of asthma.  Typically well controlled does not even need albuterol inhaler.  Due to current illness having exacerbations of shortness of breath and wheezing.  Rx albuterol inhaler.  Explained to her that her symptoms may be better controlled with daily Symbicort in addition to albuterol as needed.  At this time patient would like to only stay on albuterol.  Advised that if she finds her self using it multiple times a week that we should try the Symbicort again.

## 2021-12-09 NOTE — Progress Notes (Signed)
MyChart Video Visit    Virtual Visit via Video Note   This visit type was conducted due to national recommendations for restrictions regarding the COVID-19 Pandemic (e.g. social distancing) in an effort to limit this patient's exposure and mitigate transmission in our community. This patient is at least at moderate risk for complications without adequate follow up. This format is felt to be most appropriate for this patient at this time. Physical exam was limited by quality of the video and audio technology used for the visit.   Patient location: home Provider location: [facility]  I discussed the limitations of evaluation and management by telemedicine and the availability of in person appointments. The patient expressed understanding and agreed to proceed.  Patient: Terri Cruz   DOB: 1961/03/11   61 y.o. Female  MRN: [id number] Visit Date: 12/09/2021  Today's healthcare provider: Mikey Kirschner, PA-C   Chief Complaint  Patient presents with   Cough   Subjective      Terri Cruz is a 61 y/o female who presents today for a cough x 4 weeks. This is a new problem. The current episode started 1 to 4 weeks ago. The problem has Cruz gradually improving. The cough is Non-productive. Associated symptoms include rhinorrhea, shortness of breath and wheezing. Pertinent negatives include no chills, ear congestion, ear pain, fever, nasal congestion or sore throat. Exacerbated by: activity. Treatments tried: mucinex. The treatment provided moderate relief.   Medications: Outpatient Medications Prior to Visit  Medication Sig   meloxicam (MOBIC) 15 MG tablet TAKE 1 TABLET BY MOUTH ONCE DAILY   budesonide-formoterol (SYMBICORT) 160-4.5 MCG/ACT inhaler TAKE 2 PUFFS BY MOUTH TWICE A DAY.   [DISCONTINUED] cefdinir (OMNICEF) 300 MG capsule Take 1 capsule (300 mg total) by mouth 2 (two) times daily.   [DISCONTINUED] cyclobenzaprine (FLEXERIL) 5 MG tablet Take 1 tablet (5 mg total) by mouth 3 (three) times  daily as needed for muscle spasms. (Patient not taking: Reported on 12/09/2021)   [DISCONTINUED] penicillin v potassium (VEETID) 500 MG tablet penicillin V potassium 500 mg tablet (Patient not taking: No sig reported)   [DISCONTINUED] predniSONE (DELTASONE) 10 MG tablet Take 6 pills on day 1, 5 pills on day 2 and so on until complete. (Patient not taking: Reported on 12/09/2021)   [DISCONTINUED] UNABLE TO FIND Symbicort 160 mcg-4.5 mcg/actuation HFA aerosol inhaler   Facility-Administered Medications Prior to Visit  Medication Dose Route Frequency Provider   albuterol (PROVENTIL) (2.5 MG/3ML) 0.083% nebulizer solution 2.5 mg  2.5 mg Nebulization Once Chrismon, Vickki Muff, PA-C    Review of Systems  Constitutional:  Negative for chills and fever.  HENT:  Positive for rhinorrhea. Negative for ear pain and sore throat.   Respiratory:  Positive for cough, shortness of breath and wheezing.      Objective    LMP 02/11/2015     Physical Exam Neurological:     Mental Status: She is oriented to person, place, and time.  Psychiatric:        Mood and Affect: Mood normal.        Behavior: Behavior normal.       Assessment & Plan     Problem List Items Addressed This Visit       Respiratory   Asthma, mild persistent - Primary    Per patient history of asthma.  Typically well controlled does not even need albuterol inhaler.  Due to current illness having exacerbations of shortness of breath and wheezing.  Rx albuterol inhaler.  Explained  to her that her symptoms may be better controlled with daily Symbicort in addition to albuterol as needed.  At this time patient would like to only stay on albuterol.  Advised that if she finds her self using it multiple times a week that we should try the Symbicort again.      Relevant Medications   albuterol (VENTOLIN HFA) 108 (90 Base) MCG/ACT inhaler   Other Visit Diagnoses     Acute bronchitis, unspecified organism       Relevant Medications    albuterol (VENTOLIN HFA) 108 (90 Base) MCG/ACT inhaler        Return in about 3 months (around 03/09/2022) for asthma, chronic cough.     I discussed the assessment and treatment plan with the patient. The patient was provided an opportunity to ask questions and all were answered. The patient agreed with the plan and demonstrated an understanding of the instructions.   The patient was advised to call back or seek an in-person evaluation if the symptoms worsen or if the condition fails to improve as anticipated.  I provided 15 minutes of non-face-to-face time during this encounter.  I, Mikey Kirschner, PA-C have reviewed all documentation for this visit. The documentation on  12/09/2021 for the exam, diagnosis, procedures, and orders are all accurate and complete.   Mikey Kirschner, PA-C [facility] [phone] (phone) [phone] (fax)  [place]

## 2021-12-11 ENCOUNTER — Telehealth: Payer: BC Managed Care – PPO | Admitting: Physician Assistant

## 2021-12-28 ENCOUNTER — Ambulatory Visit: Payer: Self-pay

## 2021-12-28 ENCOUNTER — Ambulatory Visit (INDEPENDENT_AMBULATORY_CARE_PROVIDER_SITE_OTHER): Payer: BC Managed Care – PPO | Admitting: Family Medicine

## 2021-12-28 ENCOUNTER — Other Ambulatory Visit: Payer: Self-pay

## 2021-12-28 ENCOUNTER — Ambulatory Visit
Admission: RE | Admit: 2021-12-28 | Discharge: 2021-12-28 | Disposition: A | Discharge status: Home / Self Care | Payer: BC Managed Care – PPO | Attending: Family Medicine | Admitting: Family Medicine

## 2021-12-28 ENCOUNTER — Encounter: Payer: Self-pay | Admitting: Family Medicine

## 2021-12-28 ENCOUNTER — Ambulatory Visit
Admission: RE | Admit: 2021-12-28 | Discharge: 2021-12-28 | Disposition: A | Discharge status: Home / Self Care | Payer: BC Managed Care – PPO | Source: Ambulatory Visit | Attending: Family Medicine | Admitting: Family Medicine

## 2021-12-28 VITALS — BP 169/67 | HR 95 | Temp 98.1°F | Resp 18 | Wt 160.1 lb

## 2021-12-28 DIAGNOSIS — R011 Cardiac murmur, unspecified: Secondary | ICD-10-CM | POA: Diagnosis not present

## 2021-12-28 DIAGNOSIS — J4541 Moderate persistent asthma with (acute) exacerbation: Secondary | ICD-10-CM

## 2021-12-28 DIAGNOSIS — J9801 Acute bronchospasm: Secondary | ICD-10-CM

## 2021-12-28 DIAGNOSIS — R062 Wheezing: Secondary | ICD-10-CM | POA: Diagnosis not present

## 2021-12-28 MED ORDER — METHYLPREDNISOLONE ACETATE 40 MG/ML IJ SUSP
40.0000 mg | Freq: Once | INTRAMUSCULAR | Status: AC
  Administered 2021-12-28: 40 mg via INTRAMUSCULAR

## 2021-12-28 MED ORDER — CETIRIZINE HCL 10 MG PO TABS: 10.0000 mg | ORAL_TABLET | Freq: Every day | ORAL | 11 refills | Status: DC

## 2021-12-28 MED ORDER — MONTELUKAST SODIUM 10 MG PO TABS: 10.0000 mg | ORAL_TABLET | Freq: Every day | ORAL | 3 refills | Status: DC

## 2021-12-28 MED ORDER — BREZTRI AEROSPHERE 160-9-4.8 MCG/ACT IN AERO: 2.0000 | INHALATION_SPRAY | Freq: Two times a day (BID) | RESPIRATORY_TRACT | 11 refills | Status: DC

## 2021-12-28 MED ORDER — OPTICHAMBER DIAMOND MISC: 1 refills | Status: DC

## 2021-12-28 MED ORDER — BUDESONIDE-FORMOTEROL FUMARATE 160-4.5 MCG/ACT IN AERO: 2.0000 | INHALATION_SPRAY | Freq: Two times a day (BID) | RESPIRATORY_TRACT | 12 refills | Status: DC

## 2021-12-28 MED ORDER — PREDNISONE 10 MG PO TABS: ORAL_TABLET | ORAL | 0 refills | Status: AC

## 2021-12-28 NOTE — Progress Notes (Signed)
Established patient visit   Patient: Terri Cruz   DOB: 11-24-1961   61 y.o. Female  MRN: [id number] Visit Date: 12/28/2021  Today's healthcare provider: Gwyneth Sprout, FNP   Chief Complaint  Patient presents with   Cough   Subjective    Cough This is a new problem. The current episode started 1 to 4 weeks ago. The problem has been unchanged. The problem occurs constantly. The cough is Productive of sputum. Associated symptoms include postnasal drip, a rash (patient reports hives), rhinorrhea, shortness of breath and wheezing. Pertinent negatives include no chest pain, chills, ear congestion, ear pain, fever, headaches, heartburn, hemoptysis, myalgias, nasal congestion, sore throat, sweats or weight loss. Her past medical history is significant for asthma and bronchitis. There is no history of COPD, emphysema, environmental allergies or pneumonia.    Medications: Outpatient Medications Prior to Visit  Medication Sig   meloxicam (MOBIC) 15 MG tablet TAKE 1 TABLET BY MOUTH ONCE DAILY   albuterol (VENTOLIN HFA) 108 (90 Base) MCG/ACT inhaler Inhale 2 puffs into the lungs every 6 (six) hours as needed for wheezing or shortness of breath. (Patient not taking: Reported on 12/28/2021)   budesonide-formoterol (SYMBICORT) 160-4.5 MCG/ACT inhaler TAKE 2 PUFFS BY MOUTH TWICE A DAY. (Patient not taking: Reported on 12/28/2021)   Facility-Administered Medications Prior to Visit  Medication Dose Route Frequency Provider   albuterol (PROVENTIL) (2.5 MG/3ML) 0.083% nebulizer solution 2.5 mg  2.5 mg Nebulization Once Chrismon, Vickki Muff, PA-C    Review of Systems  Constitutional:  Negative for chills, fever and weight loss.  HENT:  Positive for postnasal drip and rhinorrhea. Negative for ear pain and sore throat.   Respiratory:  Positive for cough, shortness of breath and wheezing. Negative for hemoptysis.   Cardiovascular:  Negative for chest pain.  Gastrointestinal:  Negative for  heartburn.  Musculoskeletal:  Negative for myalgias.  Skin:  Positive for rash (patient reports hives).  Allergic/Immunologic: Negative for environmental allergies.  Neurological:  Negative for headaches.      Objective    BP (!) 169/67    Pulse 95    Temp 98.1 F (36.7 C) (Oral)    Resp 18    Wt 160 lb 1.6 oz (72.6 kg)    LMP 02/11/2015    SpO2 93%    BMI 25.84 kg/m    Physical Exam Vitals and nursing note reviewed.  Constitutional:      General: She is not in acute distress.    Appearance: Normal appearance. She is overweight. She is ill-appearing. She is not toxic-appearing or diaphoretic.  HENT:     Head: Normocephalic and atraumatic.  Cardiovascular:     Rate and Rhythm: Normal rate and regular rhythm.     Pulses: Normal pulses.     Heart sounds: Normal heart sounds. No murmur heard.   No friction rub. No gallop.  Pulmonary:     Effort: Pulmonary effort is normal. Prolonged expiration present. No respiratory distress.     Breath sounds: Decreased air movement present. No stridor. Examination of the right-upper field reveals decreased breath sounds and wheezing. Examination of the left-upper field reveals decreased breath sounds and wheezing. Examination of the right-middle field reveals decreased breath sounds and wheezing. Examination of the left-middle field reveals decreased breath sounds and wheezing. Examination of the right-lower field reveals decreased breath sounds and wheezing. Examination of the left-lower field reveals decreased breath sounds and wheezing. Decreased breath sounds and wheezing present. No rhonchi or  rales.  Chest:     Chest wall: No tenderness.  Abdominal:     General: Bowel sounds are normal.     Palpations: Abdomen is soft.  Musculoskeletal:        General: No swelling, tenderness, deformity or signs of injury. Normal range of motion.     Right lower leg: No edema.     Left lower leg: No edema.  Skin:    General: Skin is warm and dry.      Capillary Refill: Capillary refill takes less than 2 seconds.     Coloration: Skin is not jaundiced or pale.     Findings: No bruising, erythema, lesion or rash.  Neurological:     General: No focal deficit present.     Mental Status: She is alert and oriented to person, place, and time. Mental status is at baseline.     Cranial Nerves: No cranial nerve deficit.     Sensory: No sensory deficit.     Motor: No weakness.     Coordination: Coordination normal.  Psychiatric:        Mood and Affect: Mood normal.        Behavior: Behavior normal.        Thought Content: Thought content normal.        Judgment: Judgment normal.     No results found for any visits on 12/28/21.  Assessment & Plan     Problem List Items Addressed This Visit       Respiratory   Moderate persistent asthma with exacerbation - Primary    Flare of asthma -steroid injection given in office -prolong taper  -2 view taper -daily medication needed; has been out -has been heavy on PRN inhaler d/t poor inhalation- ordered diamond view optichamber to assist -add zyrtec -add singulair -unaware of allergen source      Relevant Medications   cetirizine (ZYRTEC) 10 MG tablet   predniSONE (DELTASONE) 10 MG tablet (Start on 12/29/2021)   Spacer/Aero-Holding Chambers (OPTICHAMBER DIAMOND) MISC   montelukast (SINGULAIR) 10 MG tablet   Budeson-Glycopyrrol-Formoterol (BREZTRI AEROSPHERE) 160-9-4.8 MCG/ACT AERO   budesonide-formoterol (SYMBICORT) 160-4.5 MCG/ACT inhaler   Other Relevant Orders   DG Chest 2 View     Other   Cardiac murmur    Chronic, stable Continue to monitor Unable to grade well d/t wheeze      Cough due to bronchospasm    Likely d/t asthma flare Voiced husband has had a cough; but no other symptoms Do not think symptoms are related Recently had flu vaccine        Return in about 1 month (around 01/28/2022), or if symptoms worsen or fail to improve.      Vonna Kotyk, FNP, have  reviewed all documentation for this visit. The documentation on 12/28/21 for the exam, diagnosis, procedures, and orders are all accurate and complete.  Patient seen and examined by Tally Joe,  FNP note scribed by [employer], [place], [place] [phone] (phone) [phone] (fax)  [place]

## 2021-12-28 NOTE — Assessment & Plan Note (Signed)
Flare of asthma -steroid injection given in office -prolong taper  -2 view taper -daily medication needed; has been out -has been heavy on PRN inhaler d/t poor inhalation- ordered diamond view optichamber to assist -add zyrtec -add singulair -unaware of allergen source

## 2021-12-28 NOTE — Progress Notes (Signed)
No pneumonia on xray; proceed with inhalers and steroids for asthma flare.  Please let us know if you have any questions.  Thank you,  Tally Joe, FNP

## 2021-12-28 NOTE — Assessment & Plan Note (Signed)
Likely d/t asthma flare Voiced husband has had a cough; but no other symptoms Do not think symptoms are related Recently had flu vaccine

## 2021-12-28 NOTE — Assessment & Plan Note (Signed)
Chronic, stable Continue to monitor Unable to grade well d/t wheeze

## 2021-12-28 NOTE — Telephone Encounter (Signed)
° °  Chief Complaint: Cough with shortness of breath Symptoms: Productive cough with yellow,green, brown mucus. Frequency: Started this month. Pertinent Negatives: Patient denies fever. Instructed to call 911 for worsening of symptoms. Disposition: [] ED /[] Urgent Care (no appt availability in office) / [x] Appointment(In office/virtual)/ []  [place] [facility]/ [] Home Care/ [] Refused Recommended Disposition /[] [place] [facility]/ []  Follow-up with PCP Additional Notes: Appointment today per [place] in the practice.   Reason for Disposition  [1] MILD difficulty breathing (e.g., minimal/no SOB at rest, SOB with walking, pulse <100) AND [2] NEW-onset or WORSE than normal  Answer Assessment - Initial Assessment Questions 1. RESPIRATORY STATUS: "Describe your breathing?" (e.g., wheezing, shortness of breath, unable to speak, severe coughing)      Shortness of breath 2. ONSET: "When did this breathing problem begin?"      2 weeks ago 3. PATTERN "Does the difficult breathing come and go, or has it been constant since it started?"      Constant 4. SEVERITY: "How bad is your breathing?" (e.g., mild, moderate, severe)    - MILD: No SOB at rest, mild SOB with walking, speaks normally in sentences, can lie down, no retractions, pulse < 100.    - MODERATE: SOB at rest, SOB with minimal exertion and prefers to sit, cannot lie down flat, speaks in phrases, mild retractions, audible wheezing, pulse 100-120.    - SEVERE: Very SOB at rest, speaks in single words, struggling to breathe, sitting hunched forward, retractions, pulse > 120      Moderate 5. RECURRENT SYMPTOM: "Have you had difficulty breathing before?" If Yes, ask: "When was the last time?" and "What happened that time?"      Yes 6. CARDIAC HISTORY: "Do you have any history of heart disease?" (e.g., heart attack, angina, bypass surgery, angioplasty)      No 7. LUNG HISTORY: "Do you have any history of lung disease?"  (e.g., pulmonary  embolus, asthma, emphysema)     Asthma 8. CAUSE: "What do you think is causing the breathing problem?"      URI 9. OTHER SYMPTOMS: "Do you have any other symptoms? (e.g., dizziness, runny nose, cough, chest pain, fever)     No 10. O2 SATURATION MONITOR:  "Do you use an oxygen saturation monitor (pulse oximeter) at home?" If Yes, "What is your reading (oxygen level) today?" "What is your usual oxygen saturation reading?" (e.g., 95%)       No 11. PREGNANCY: "Is there any chance you are pregnant?" "When was your last menstrual period?"       No 12. TRAVEL: "Have you traveled out of the country in the last month?" (e.g., travel history, exposures)       No  Protocols used: Breathing Difficulty-A-AH

## 2021-12-30 ENCOUNTER — Telehealth: Payer: Self-pay

## 2021-12-30 DIAGNOSIS — J4541 Moderate persistent asthma with (acute) exacerbation: Secondary | ICD-10-CM

## 2021-12-30 NOTE — Telephone Encounter (Signed)
Patient aware. Per patient her Symbicort is $600 out of pocket and she is not able to afford. Patient requested if she can talk to the clinic pharmacist to see if he can help her.

## 2021-12-30 NOTE — Telephone Encounter (Signed)
I placed an order for CCM to reach out to patient. KW

## 2021-12-30 NOTE — Telephone Encounter (Signed)
-----   Message from Gwyneth Sprout, FNP sent at 12/28/2021  5:03 PM EST ----- No pneumonia on xray; proceed with inhalers and steroids for asthma flare.  Please let us know if you have any questions.  Thank you,  Tally Joe, FNP

## 2022-01-22 ENCOUNTER — Other Ambulatory Visit: Payer: Self-pay

## 2022-01-22 ENCOUNTER — Ambulatory Visit: Payer: Self-pay | Admitting: *Deleted

## 2022-01-22 ENCOUNTER — Emergency Department: Payer: BC Managed Care – PPO

## 2022-01-22 ENCOUNTER — Emergency Department
Admission: EM | Admit: 2022-01-22 | Discharge: 2022-01-22 | Disposition: A | Discharge status: Home / Self Care | Payer: BC Managed Care – PPO | Attending: Emergency Medicine | Admitting: Emergency Medicine

## 2022-01-22 DIAGNOSIS — R000 Tachycardia, unspecified: Secondary | ICD-10-CM | POA: Diagnosis not present

## 2022-01-22 DIAGNOSIS — R0602 Shortness of breath: Secondary | ICD-10-CM | POA: Diagnosis not present

## 2022-01-22 DIAGNOSIS — J45901 Unspecified asthma with (acute) exacerbation: Secondary | ICD-10-CM | POA: Diagnosis not present

## 2022-01-22 DIAGNOSIS — Z20822 Contact with and (suspected) exposure to covid-19: Secondary | ICD-10-CM | POA: Insufficient documentation

## 2022-01-22 LAB — CBC WITH DIFFERENTIAL/PLATELET
Abs Immature Granulocytes: 0.03 10*3/uL (ref 0.00–0.07)
Basophils Absolute: 0 10*3/uL (ref 0.0–0.1)
Basophils Relative: 0 %
Eosinophils Absolute: 0.2 10*3/uL (ref 0.0–0.5)
Eosinophils Relative: 2 %
HCT: 39.9 % (ref 36.0–46.0)
Hemoglobin: 12.6 g/dL (ref 12.0–15.0)
Immature Granulocytes: 0 %
Lymphocytes Relative: 18 %
Lymphs Abs: 1.8 10*3/uL (ref 0.7–4.0)
MCH: 30.1 pg (ref 26.0–34.0)
MCHC: 31.6 g/dL (ref 30.0–36.0)
MCV: 95.2 fL (ref 80.0–100.0)
Monocytes Absolute: 0.5 10*3/uL (ref 0.1–1.0)
Monocytes Relative: 5 %
Neutro Abs: 7.6 10*3/uL (ref 1.7–7.7)
Neutrophils Relative %: 75 %
Platelets: 356 10*3/uL (ref 150–400)
RBC: 4.19 MIL/uL (ref 3.87–5.11)
RDW: 15 % (ref 11.5–15.5)
WBC: 10.1 10*3/uL (ref 4.0–10.5)
nRBC: 0 % (ref 0.0–0.2)

## 2022-01-22 LAB — RESP PANEL BY RT-PCR (FLU A&B, COVID) ARPGX2
Influenza A by PCR: NEGATIVE
Influenza B by PCR: NEGATIVE
SARS Coronavirus 2 by RT PCR: NEGATIVE

## 2022-01-22 LAB — BASIC METABOLIC PANEL
Anion gap: 9 (ref 5–15)
BUN: 10 mg/dL (ref 8–23)
CO2: 28 mmol/L (ref 22–32)
Calcium: 9.9 mg/dL (ref 8.9–10.3)
Chloride: 100 mmol/L (ref 98–111)
Creatinine, Ser: 0.56 mg/dL (ref 0.44–1.00)
GFR, Estimated: >60 mL/min (ref >60)
Glucose, Bld: 139 mg/dL — ABNORMAL HIGH (ref 70–99)
Potassium: 4.2 mmol/L (ref 3.5–5.1)
Sodium: 137 mmol/L (ref 135–145)

## 2022-01-22 LAB — TROPONIN I (HIGH SENSITIVITY): Troponin I (High Sensitivity): 7 ng/L (ref <18)

## 2022-01-22 MED ORDER — ALBUTEROL SULFATE (2.5 MG/3ML) 0.083% IN NEBU
2.5000 mg | INHALATION_SOLUTION | Freq: Once | RESPIRATORY_TRACT | Status: AC
  Administered 2022-01-22: 2.5 mg via RESPIRATORY_TRACT
  Filled 2022-01-22: qty 3

## 2022-01-22 MED ORDER — ALBUTEROL SULFATE HFA 108 (90 BASE) MCG/ACT IN AERS: 2.0000 | INHALATION_SPRAY | RESPIRATORY_TRACT | 0 refills | Status: DC | PRN

## 2022-01-22 MED ORDER — PREDNISONE 20 MG PO TABS: 60.0000 mg | ORAL_TABLET | Freq: Every day | ORAL | 0 refills | Status: DC

## 2022-01-22 MED ORDER — METHYLPREDNISOLONE SODIUM SUCC 125 MG IJ SOLR
125.0000 mg | Freq: Once | INTRAMUSCULAR | Status: AC
  Administered 2022-01-22: 125 mg via INTRAVENOUS
  Filled 2022-01-22: qty 2

## 2022-01-22 MED ORDER — IPRATROPIUM-ALBUTEROL 0.5-2.5 (3) MG/3ML IN SOLN
3.0000 mL | Freq: Once | RESPIRATORY_TRACT | Status: AC
  Administered 2022-01-22: 3 mL via RESPIRATORY_TRACT
  Filled 2022-01-22: qty 3

## 2022-01-22 MED ORDER — SODIUM CHLORIDE 0.9 % IV BOLUS
500.0000 mL | Freq: Once | INTRAVENOUS | Status: AC
  Administered 2022-01-22: 500 mL via INTRAVENOUS

## 2022-01-22 MED ORDER — MAGNESIUM SULFATE 2 GM/50ML IV SOLN
2.0000 g | Freq: Once | INTRAVENOUS | Status: AC
  Administered 2022-01-22: 2 g via INTRAVENOUS
  Filled 2022-01-22: qty 50

## 2022-01-22 MED ORDER — BUDESONIDE-FORMOTEROL FUMARATE 160-4.5 MCG/ACT IN AERO: 2.0000 | INHALATION_SPRAY | Freq: Two times a day (BID) | RESPIRATORY_TRACT | 0 refills | Status: DC

## 2022-01-22 NOTE — Discharge Instructions (Addendum)
Take the prednisone as prescribed starting tomorrow.  Use your albuterol up to every 4 hours as needed for shortness of breath.  Take the generic Symbicort as prescribed.  Return to the ER for new, worsening, or persistent severe difficulty breathing, chest pain, fever, or any other new or worsening symptoms that concern you.

## 2022-01-22 NOTE — ED Triage Notes (Signed)
Pt in with co shob states hx of asthma, started at 2330 yesterday.

## 2022-01-22 NOTE — ED Notes (Signed)
Neb finished.  Respirations non labored.  No dyspnea noted.  Lungs with improved air entry auscultated bilaterally

## 2022-01-22 NOTE — ED Notes (Signed)
Pt provided discharge instructions and prescription information. Pt was given the opportunity to ask questions and questions were answered. Discharge signature not obtained in the setting of the COVID-19 pandemic in order to reduce high touch surfaces.  ° °

## 2022-01-22 NOTE — Telephone Encounter (Signed)
Reason for Disposition  [1] MODERATE difficulty breathing (e.g., speaks in phrases, SOB even at rest, pulse 100-120) AND [2] NEW-onset or WORSE than normal  Answer Assessment - Initial Assessment Questions 1. RESPIRATORY STATUS: "Describe your breathing?" (e.g., wheezing, shortness of breath, unable to speak, severe coughing)      Wheezing, cough, not able to take deep breath 2. ONSET: "When did this breathing problem begin?"      Wheezing yesterday afternoon 3. PATTERN "Does the difficult breathing come and go, or has it been constant since it started?"      Constant- inhaler not working 4. SEVERITY: "How bad is your breathing?" (e.g., mild, moderate, severe)    - MILD: No SOB at rest, mild SOB with walking, speaks normally in sentences, can lie down, no retractions, pulse < 100.    - MODERATE: SOB at rest, SOB with minimal exertion and prefers to sit, cannot lie down flat, speaks in phrases, mild retractions, audible wheezing, pulse 100-120.    - SEVERE: Very SOB at rest, speaks in single words, struggling to breathe, sitting hunched forward, retractions, pulse > 120      severe 5. RECURRENT SYMPTOM: "Have you had difficulty breathing before?" If Yes, ask: "When was the last time?" and "What happened that time?"      asthma 6. CARDIAC HISTORY: "Do you have any history of heart disease?" (e.g., heart attack, angina, bypass surgery, angioplasty)      *No Answer* 7. LUNG HISTORY: "Do you have any history of lung disease?"  (e.g., pulmonary embolus, asthma, emphysema)     asthma 8. CAUSE: "What do you think is causing the breathing problem?"      High pollen yesterday 9. OTHER SYMPTOMS: "Do you have any other symptoms? (e.g., dizziness, runny nose, cough, chest pain, fever)     *No Answer* 10. O2 SATURATION MONITOR:  "Do you use an oxygen saturation monitor (pulse oximeter) at home?" If Yes, "What is your reading (oxygen level) today?" "What is your usual oxygen saturation reading?" (e.g.,  95%)       *No Answer* 11. PREGNANCY: "Is there any chance you are pregnant?" "When was your last menstrual period?"       *No Answer* 12. TRAVEL: "Have you traveled out of the country in the last month?" (e.g., travel history, exposures)       *No Answer*  Protocols used: Breathing Difficulty-A-AH

## 2022-01-22 NOTE — Telephone Encounter (Signed)
°  Chief Complaint: trouble breathing Symptoms: SOB, wheezing, cough Frequency: started yesterday Pertinent Negatives: Patient denies   Disposition: [x] ED /[] Urgent Care (no appt availability in office) / [] Appointment(In office/virtual)/ []  [place] [facility]/ [] Home Care/ [] Refused Recommended Disposition /[] [place] [facility]/ []  Follow-up with PCP Additional Notes:

## 2022-01-22 NOTE — ED Provider Notes (Signed)
[facility] Provider Note    Event Date/Time   First MD Initiated Contact with Patient 01/22/22 [phone]     (approximate)   History   Shortness of Breath   HPI  Terri Cruz is a 61 y.o. female with a history of asthma who presents with shortness of breath, acute onset last night, persistent course over the last 10 hours, and associated with audible wheezing but no cough or fever.  The patient denies any chest pain.  She states that the symptoms feel similar to prior asthma exacerbations.  She has used albuterol at home with no significant relief.    Physical Exam   Triage Vital Signs: ED Triage Vitals  Enc Vitals Group     BP 01/22/22 0836 (!) 179/78     Pulse Rate 01/22/22 0836 (!) 101     Resp 01/22/22 0836 (!) 22     Temp 01/22/22 0836 97.9 F (36.6 C)     Temp Source 01/22/22 0836 Oral     SpO2 01/22/22 0836 91 %     Weight 01/22/22 0834 160 lb (72.6 kg)     Height 01/22/22 0834 5\' 6"  (1.676 m)     Head Circumference --      Peak Flow --      Pain Score --      Pain Loc --      Pain Edu? --      Excl. in [place]? --     Most recent vital signs: Vitals:   01/22/22 1200 01/22/22 1300  BP: (!) 147/63 (!) 146/64  Pulse: (!) 110 96  Resp: 10 16  Temp:    SpO2: 94% 97%     General: Awake, no distress.  CV:  Good peripheral perfusion.  Normal heart sounds. Resp:  Increased respiratory effort with some accessory muscle use.  Diminished breath sounds bilaterally with scattered wheezing. Abd:  No distention.  Other:  No peripheral edema.   ED Results / Procedures / Treatments   Labs (all labs ordered are listed, but only abnormal results are displayed) Labs Reviewed  BASIC METABOLIC PANEL - Abnormal; Notable for the following components:      Result Value   Glucose, Bld 139 (*)    All other components within normal limits  RESP PANEL BY RT-PCR (FLU A&B, COVID) ARPGX2  CBC WITH DIFFERENTIAL/PLATELET  TROPONIN I (HIGH SENSITIVITY)      EKG  ED ECG REPORT I, Arta Silence, the attending physician, personally viewed and interpreted this ECG.  Date: 01/22/2022 EKG Time: 0843 Rate: 96 Rhythm: normal sinus rhythm QRS Axis: Borderline right axis Intervals: normal ST/T Wave abnormalities: normal Narrative Interpretation: no evidence of acute ischemia    RADIOLOGY  I independently viewed and interpreted the chest x-ray; there is no evidence of focal consolidation, edema, or other acute abnormality.  PROCEDURES:  Critical Care performed: No  Procedures   MEDICATIONS ORDERED IN ED: Medications  sodium chloride 0.9 % bolus 500 mL (0 mLs Intravenous Stopped 01/22/22 1002)  ipratropium-albuterol (DUONEB) 0.5-2.5 (3) MG/3ML nebulizer solution 3 mL (3 mLs Nebulization Given 01/22/22 0851)  methylPREDNISolone sodium succinate (SOLU-MEDROL) 125 mg/2 mL injection 125 mg (125 mg Intravenous Given 01/22/22 0853)  magnesium sulfate IVPB 2 g 50 mL (0 g Intravenous Stopped 01/22/22 1002)  ipratropium-albuterol (DUONEB) 0.5-2.5 (3) MG/3ML nebulizer solution 3 mL (3 mLs Nebulization Given 01/22/22 0850)  albuterol (PROVENTIL) (2.5 MG/3ML) 0.083% nebulizer solution 2.5 mg (2.5 mg Nebulization Given 01/22/22 1106)  albuterol (  PROVENTIL) (2.5 MG/3ML) 0.083% nebulizer solution 2.5 mg (2.5 mg Nebulization Given 01/22/22 1106)     IMPRESSION / MDM / ASSESSMENT AND PLAN / ED COURSE  I reviewed the triage vital signs and the nursing notes.  61 year old female with a history of asthma presents with shortness of breath since last night with wheezing, feeling similar to prior asthma exacerbations.  On exam the vital signs are normal except for borderline tachycardia and tachypnea.  O2 saturation is in the low 90s on room air.  The patient has increased work of breathing and some accessory muscle use.  Breath sounds are significantly diminished bilaterally with some wheezing.  Differential diagnosis includes, but is not limited to,  asthma exacerbation, acute bronchitis, COVID-19 or influenza, other viral etiology, pneumonia.  I have a low clinical suspicion for PE given the presence of abnormal breath sounds and lack of chest pain.  We will obtain a chest x-ray, lab work-up, and give bronchodilators, steroids, and magnesium.  The patient is on the cardiac monitor to evaluate for evidence of arrhythmia and/or significant heart rate changes.   ----------------------------------------- 1:18 PM on 01/22/2022 -----------------------------------------  The patient initially had some improvement after magnesium, Solu-Medrol, and DuoNebs.  However she still felt significantly short of breath.  She was given some additional albuterol treatments and now feels significantly better.  Lab work-up is unremarkable.  BMP and CBC are normal.  Troponin is negative.  Given the duration of the symptoms since last night there is no indication for a repeat.  Respiratory panel is negative.  Chest x-ray also shows no acute findings.  At this time, the patient appears comfortable.  Her O2 saturation is in the high 90s.  She was able to ambulate around the room without difficulty or a significant drop in her O2 saturation.  She feels well and wants to go home.  I have prescribed albuterol, prednisone, as well as Symbicort which the patient was on previously and states helped her significantly.  I gave her thorough return precautions and she expressed understanding.    FINAL CLINICAL IMPRESSION(S) / ED DIAGNOSES   Final diagnoses:  Exacerbation of asthma, unspecified asthma severity, unspecified whether persistent     Rx / DC Orders   ED Discharge Orders          Ordered    predniSONE (DELTASONE) 20 MG tablet  Daily with breakfast        01/22/22 1315    albuterol (VENTOLIN HFA) 108 (90 Base) MCG/ACT inhaler  Every 4 hours PRN        01/22/22 1315    budesonide-formoterol (SYMBICORT) 160-4.5 MCG/ACT inhaler  2 times daily         01/22/22 1315             Note:  This document was prepared using Dragon voice recognition software and may include unintentional dictation errors.    Arta Silence, MD 01/22/22 1320

## 2022-01-22 NOTE — ED Notes (Signed)
Pt ambulated in room. Pt able to speak in complete sentences during ambulation. SpO2 remained above 95% and HR to 105 with ambulation. Pt subjectively reports significant improvement.

## 2022-01-22 NOTE — ED Notes (Signed)
Ambulated to commode, tolerated well. No DOE

## 2022-01-25 ENCOUNTER — Other Ambulatory Visit: Payer: Self-pay

## 2022-01-25 ENCOUNTER — Telehealth: Payer: Self-pay

## 2022-01-25 DIAGNOSIS — J4541 Moderate persistent asthma with (acute) exacerbation: Secondary | ICD-10-CM

## 2022-01-25 MED ORDER — OPTICHAMBER DIAMOND MISC: 1 refills | Status: DC

## 2022-01-25 NOTE — Telephone Encounter (Signed)
Pt states that [place] [facility] [employer] are saying they did not receive a Rx for Spacer/Aero-Holding Chambers ([place]) MISC.  She is asking if we can resend.

## 2022-01-25 NOTE — Telephone Encounter (Signed)
Done

## 2022-01-26 NOTE — Progress Notes (Signed)
Ar[place] Ponder [place],acting as a scribe for Gwyneth Sprout, FNP.,have documented all relevant documentation on the behalf of Gwyneth Sprout, FNP,as directed by  Gwyneth Sprout, FNP while in the presence of Gwyneth Sprout, FNP.     Established patient visit   Patient: Terri Cruz   DOB: November 12, 1961   61 y.o. Female  MRN: [id number] Visit Date: 01/27/2022  Today's healthcare provider: Gwyneth Sprout, FNP   Chief Complaint  Patient presents with   Asthma   I,Sulibeya S Dimas,acting as a scribe for Gwyneth Sprout, FNP.,have documented all relevant documentation on the behalf of Gwyneth Sprout, FNP,as directed by  Gwyneth Sprout, FNP while in the presence of Gwyneth Sprout, FNP.  Subjective    HPI  Follow up ER visit  Patient was seen in ER for exacerbation of asthma on 01/22/22. She was treated for exacerbation of asthma. Treatment for this included prednisone 20mg , albuterol 108 mcg, and symbicort 160-4.33mcg. . She reports excellent compliance with treatment. She reports this condition is Improved.  -----------------------------------------------------------------------------------------    Medications: Outpatient Medications Prior to Visit  Medication Sig   albuterol (VENTOLIN HFA) 108 (90 Base) MCG/ACT inhaler Inhale 2 puffs into the lungs every 4 (four) hours as needed for wheezing or shortness of breath.   budesonide-formoterol (SYMBICORT) 160-4.5 MCG/ACT inhaler Inhale 2 puffs into the lungs in the morning and at bedtime.   cetirizine (ZYRTEC) 10 MG tablet Take 1 tablet (10 mg total) by mouth daily.   meloxicam (MOBIC) 15 MG tablet TAKE 1 TABLET BY MOUTH ONCE DAILY   montelukast (SINGULAIR) 10 MG tablet Take 1 tablet (10 mg total) by mouth at bedtime.   Spacer/Aero-Holding Chambers [facility] DIAMOND) MISC Attach to inhaler   [DISCONTINUED] albuterol (VENTOLIN HFA) 108 (90 Base) MCG/ACT inhaler Inhale 2 puffs into the lungs every 6 (six) hours as needed for wheezing or  shortness of breath. (Patient not taking: Reported on 12/28/2021)   [DISCONTINUED] Budeson-Glycopyrrol-Formoterol (BREZTRI AEROSPHERE) 160-9-4.8 MCG/ACT AERO Inhale 2 puffs into the lungs 2 (two) times daily. (Patient not taking: Reported on 01/27/2022)   [DISCONTINUED] budesonide-formoterol (SYMBICORT) 160-4.5 MCG/ACT inhaler TAKE 2 PUFFS BY MOUTH TWICE A DAY.   [DISCONTINUED] budesonide-formoterol (SYMBICORT) 160-4.5 MCG/ACT inhaler Inhale 2 puffs into the lungs in the morning and at bedtime.   [DISCONTINUED] predniSONE (DELTASONE) 20 MG tablet Take 3 tablets (60 mg total) by mouth daily with breakfast for 5 days. Start the day after the ER visit (Patient not taking: Reported on 01/27/2022)   Facility-Administered Medications Prior to Visit  Medication Dose Route Frequency Provider   albuterol (PROVENTIL) (2.5 MG/3ML) 0.083% nebulizer solution 2.5 mg  2.5 mg Nebulization Once Chrismon, Vickki Muff, PA-C    Review of Systems      Objective    BP 140/75 (BP Location: Right Arm, Patient Position: Sitting, Cuff Size: Large)    Pulse 75    Temp 98.1 F (36.7 C) (Temporal)    Resp 16    Wt 164 lb (74.4 kg)    LMP 02/11/2015    SpO2 97%    BMI 26.47 kg/m  BP Readings from Last 3 Encounters:  01/27/22 140/75  01/22/22 (!) 146/64  12/28/21 (!) 169/67   Wt Readings from Last 3 Encounters:  01/27/22 164 lb (74.4 kg)  01/22/22 160 lb (72.6 kg)  12/28/21 160 lb 1.6 oz (72.6 kg)      Physical Exam     No results found for any visits on  01/27/22.  Assessment & Plan     Problem List Items Addressed This Visit       Respiratory   Allergic rhinitis, seasonal    Continue use of Singulair and Flonase to assist with asthma control Use masking to assist with allergens Recommend evening shower if excessive exposure      Moderate persistent asthma with exacerbation - Primary    Recently seen at local emergency room Doing well on controller inhaler Has reduced use of as needed inhaler Has  recently started wearing mask at work to address allergens Request additional referral for pulmonology due to complex of interest with previous referral due to personal reasons      Relevant Orders   Ambulatory referral to Pulmonology     Other   Encounter for screening mammogram for malignant neoplasm of breast   Relevant Orders   MM 3D SCREEN BREAST BILATERAL     Return for annual examination.  Discussed need for annual examination.  Scheduled mammogram screening for breast cancer recommended, order placed today and number provided.     Vonna Kotyk, FNP, have reviewed all documentation for this visit. The documentation on 01/27/22 for the exam, diagnosis, procedures, and orders are all accurate and complete.     Gwyneth Sprout, [place] [phone] (phone) [phone] (fax)  [place]

## 2022-01-27 ENCOUNTER — Ambulatory Visit (INDEPENDENT_AMBULATORY_CARE_PROVIDER_SITE_OTHER): Payer: BC Managed Care – PPO | Admitting: Family Medicine

## 2022-01-27 ENCOUNTER — Encounter: Payer: Self-pay | Admitting: Family Medicine

## 2022-01-27 ENCOUNTER — Other Ambulatory Visit: Payer: Self-pay

## 2022-01-27 VITALS — BP 140/75 | HR 75 | Temp 98.1°F | Resp 16 | Wt 164.0 lb

## 2022-01-27 DIAGNOSIS — J4541 Moderate persistent asthma with (acute) exacerbation: Secondary | ICD-10-CM | POA: Diagnosis not present

## 2022-01-27 DIAGNOSIS — J302 Other seasonal allergic rhinitis: Secondary | ICD-10-CM | POA: Diagnosis not present

## 2022-01-27 DIAGNOSIS — Z1231 Encounter for screening mammogram for malignant neoplasm of breast: Secondary | ICD-10-CM | POA: Diagnosis not present

## 2022-01-27 NOTE — Assessment & Plan Note (Signed)
Recently seen at local emergency room Doing well on controller inhaler Has reduced use of as needed inhaler Has recently started wearing mask at work to address allergens Request additional referral for pulmonology due to complex of interest with previous referral due to personal reasons

## 2022-01-27 NOTE — Assessment & Plan Note (Signed)
Continue use of Singulair and Flonase to assist with asthma control Use masking to assist with allergens Recommend evening shower if excessive exposure

## 2022-03-10 ENCOUNTER — Ambulatory Visit: Payer: BC Managed Care – PPO | Admitting: Physician Assistant

## 2022-03-11 ENCOUNTER — Encounter: Payer: BC Managed Care – PPO | Admitting: Family Medicine

## 2022-03-17 ENCOUNTER — Encounter: Payer: BC Managed Care – PPO | Admitting: Family Medicine

## 2022-03-18 ENCOUNTER — Encounter: Payer: Self-pay | Admitting: Pulmonary Disease

## 2022-03-18 ENCOUNTER — Other Ambulatory Visit
Admission: RE | Admit: 2022-03-18 | Discharge: 2022-03-18 | Disposition: A | Discharge status: Home / Self Care | Payer: BC Managed Care – PPO | Source: Ambulatory Visit | Attending: Pulmonary Disease | Admitting: Pulmonary Disease

## 2022-03-18 ENCOUNTER — Ambulatory Visit (INDEPENDENT_AMBULATORY_CARE_PROVIDER_SITE_OTHER): Payer: BC Managed Care – PPO | Admitting: Pulmonary Disease

## 2022-03-18 VITALS — BP 130/84 | HR 74 | Temp 98.0°F | Ht 66.0 in | Wt 159.8 lb

## 2022-03-18 DIAGNOSIS — J45909 Unspecified asthma, uncomplicated: Secondary | ICD-10-CM | POA: Diagnosis not present

## 2022-03-18 LAB — CBC WITH DIFFERENTIAL/PLATELET
Abs Immature Granulocytes: 0.02 10*3/uL (ref 0.00–0.07)
Basophils Absolute: 0.1 10*3/uL (ref 0.0–0.1)
Basophils Relative: 1 %
Eosinophils Absolute: 0.3 10*3/uL (ref 0.0–0.5)
Eosinophils Relative: 3 %
HCT: 38.2 % (ref 36.0–46.0)
Hemoglobin: 12.3 g/dL (ref 12.0–15.0)
Immature Granulocytes: 0 %
Lymphocytes Relative: 32 %
Lymphs Abs: 3.2 10*3/uL (ref 0.7–4.0)
MCH: 30.1 pg (ref 26.0–34.0)
MCHC: 32.2 g/dL (ref 30.0–36.0)
MCV: 93.6 fL (ref 80.0–100.0)
Monocytes Absolute: 0.9 10*3/uL (ref 0.1–1.0)
Monocytes Relative: 10 %
Neutro Abs: 5.3 10*3/uL (ref 1.7–7.7)
Neutrophils Relative %: 54 %
Platelets: 322 10*3/uL (ref 150–400)
RBC: 4.08 MIL/uL (ref 3.87–5.11)
RDW: 13.6 % (ref 11.5–15.5)
WBC: 9.8 10*3/uL (ref 4.0–10.5)
nRBC: 0 % (ref 0.0–0.2)

## 2022-03-18 NOTE — Patient Instructions (Addendum)
We are going to get some blood work and some breathing tests.  The blood work will tell me about potential allergies.  The breathing test will tell me about the condition of your lungs. ? ?Continue taking your Symbicort and as needed albuterol.  Continue taking Singulair. ? ?We will see you in follow-up in 2 to 3 months time call sooner should any new problems arise. ?

## 2022-03-18 NOTE — Progress Notes (Signed)
Subjective:    Patient ID: Terri Cruz, female    DOB: 01/25/1961, 61 y.o.   MRN: [id number] Patient Care Team: Jacky Kindle, FNP as PCP - General [facility] Medicine)  Chief Complaint  Patient presents with   Consult    Diagnosed with asthma in on symbicort, carried a rescue inhaler with her.  She got to where she did need it anymore.  In December she started wheezing again and felt she needed her rescue inhaler.  Her PCP prescribed the albuterol.  In January she had an attack and her Albuterol did not work.  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  The pollen comes in the door.  She was given a steroid injection and steroid taper for a month.  The next time she swept, she she ws so sob,     HPI Patient is a 61 year old remote former smoker with a 25-pack-year history of smoking and a history as noted below who presents for evaluation of asthma.  She is kindly referred by Lisabeth Pick.  The patient states that she was initially diagnosed with asthma in 2013.  She noted seasonal variation to her exacerbations.  She was maintained on inhalers at that time however she got to a point where she determined that she did not need the inhalers any longer and quit using them.  Then in around 2019 she noted that she had a month worth of cough that was nonproductive.  She required prednisone and albuterol inhalers which helped her eventually.  She notes that dusty environments aggravate her issues.  He did fine with as needed albuterol for a while but then in December 2022 she started noticing that she was "wheezing all the time".  She notes that albuterol does help this.  She has resumed taking Symbicort and as needed albuterol.  Recently Singulair was also added due to nasal symptoms and sensitivity to dust and pollen.  This has been helpful.  She has not had pulmonary function testing recently.  She has not had any fevers, chills or sweats.  She does not endorse any  sputum production and no hemoptysis.  No chest pain, lower extremity edema or calf tenderness.  He has not noted any sensitivity to aspirin or nonsteroidals.  Rarely does have sensitivity to pollens.  Does note that steroids do help her symptoms.  The patient does work and relatively dusty environments.  She has worked at the [employer] and [employer].   Review of Systems A 10 point review of systems was performed and it is as noted above otherwise negative.  History reviewed. No pertinent past medical history.  Past Surgical History:  Procedure Laterality Date   TUBAL LIGATION     Patient Active Problem List   Diagnosis Date Noted   Encounter for screening mammogram for malignant neoplasm of breast 01/27/2022   Moderate persistent asthma with exacerbation 12/28/2021   Cough due to bronchospasm 12/28/2021   Impingement syndrome of shoulder region 05/04/2018   Cardiac murmur 01/30/2016   Allergic rhinitis, seasonal 01/30/2016   Leiomyoma of uterus 01/30/2016   Family History  Problem Relation Age of Onset   Alcohol abuse Mother    Emphysema Mother    Leukemia Mother    Liver cancer Mother    Hypertension Father    Stroke Father    Breast cancer Sister    Luiz Blare' disease Sister    Breast cancer Paternal Aunt  Social History   Tobacco Use   Smoking status: Former    Packs/day: 0.50    Years: 20.00    Additional pack years: 0.00    Total pack years: 10.00    Types: Cigarettes    Quit date: 12/06/1997    Years since quitting: 25.2   Smokeless tobacco: Never  Substance Use Topics   Alcohol use: Yes    Comment: occasionally   Allergies  Allergen Reactions   Codeine     headache   Hydrocodone Bit-Homatrop Mbr     Gi issues   Current Meds  Medication Sig   meloxicam (MOBIC) 15 MG tablet TAKE 1 TABLET BY MOUTH ONCE DAILY   montelukast (SINGULAIR) 10 MG tablet Take 1 tablet (10 mg total) by mouth at bedtime.   Spacer/Aero-Holding Chambers  (OPTICHAMBER DIAMOND) MISC Attach to inhaler   [albuterol (VENTOLIN HFA) 108 (90 Base) MCG/ACT inhaler Inhale 2 puffs into the lungs every 4 (four) hours as needed for wheezing or shortness of breath.   budesonide-formoterol (SYMBICORT) 160-4.5 MCG/ACT inhaler Inhale 2 puffs into the lungs in the morning and at bedtime.   cetirizine (ZYRTEC) 10 MG tablet Take 1 tablet (10 mg total) by mouth daily.   Immunization History  Administered Date(s) Administered   Influenza Split 10/30/2007, 08/10/2012   Influenza,inj,Quad PF,6+ Mos 10/14/2021   Influenza-Unspecified 09/05/2018   Pneumococcal Polysaccharide-23 09/26/2012   Tdap 03/25/2022        Objective:   Physical Exam BP 130/84 (BP Location: Left Arm, Patient Position: Sitting, Cuff Size: Large)   Pulse 74   Temp 98 F (36.7 C) (Oral)   Ht  (1.676 m)   Wt 159 lb 12.8 oz (72.5 kg)   LMP 02/11/2015   SpO2 97%   BMI 25.79 kg/m   GENERAL: Well-developed, well-nourished woman, no acute distress, fully ambulatory.  No conversational dyspnea. HEAD: Normocephalic, atraumatic.  EYES: Pupils equal, round, reactive to light.  No scleral icterus.  MOUTH: Dentition intact, oral mucosa moist. NECK: Supple. No thyromegaly. Trachea midline. No JVD.  No adenopathy. PULMONARY: Good air entry bilaterally.  No adventitious sounds. CARDIOVASCULAR: S1 and S2. Regular rate and rhythm.  No rubs, murmurs or gallops heard. ABDOMEN: Benign. MUSCULOSKELETAL: No joint deformity, no clubbing, no edema.  NEUROLOGIC: No overt focal deficit, no gait disturbance, speech is fluent. SKIN: Intact,warm,dry. PSYCH: Mood and behavior normal.  I have independently reviewed her chest x-rays performed 28 December 2021 and 22 January 2022 x-rays are noted to have no active cardiopulmonary disease however on my independent review hyperinflation is noted this is consistent with obstructive lung disease.     Assessment & Plan:      ICD-10-CM   1. Extrinsic asthma  without complication, unspecified asthma severity, unspecified whether persistent  J45.909 CBC w/Diff    Allergen Panel (27) + IGE    Pulmonary Function Test [facility] Only   Continue current regimen as she is doing Will obtain PFTs to better characterize Obtain CBC with differential and allergen panel     Orders Placed This Encounter  Procedures   CBC w/Diff    Standing Status:   Future    Number of Occurrences:   1    Standing Expiration Date:   03/19/2023   Allergen Panel (27) + IGE    Standing Status:   Future    Number of Occurrences:   1    Standing Expiration Date:   03/19/2023   Pulmonary Function Test [facility] Only  Standing Status:   Future    Number of Occurrences:   1    Standing Expiration Date:   03/19/2023    Order Specific Question:   Full PFT: includes the following: basic spirometry, spirometry pre & post bronchodilator, diffusion capacity (DLCO), lung volumes    Answer:   Full PFT    Order Specific Question:   This test can only be performed at    Answer:   [facility]   We will see the patient in follow-up 2 to 3 months time she is to contact us prior to that time should any new difficulties arise.  Gailen Shelter, MD Advanced Bronchoscopy PCCM  [facility]    *This note was dictated using voice recognition software/Dragon.  Despite best efforts to proofread, errors can occur which can change the meaning. Any transcriptional errors that result from this process are unintentional and may not be fully corrected at the time of dictation.

## 2022-03-22 ENCOUNTER — Telehealth: Payer: Self-pay | Admitting: Pulmonary Disease

## 2022-03-22 LAB — ALLERGEN PANEL (27) + IGE
Alternaria Alternata IgE: <0.1 kU/L
Aspergillus Fumigatus IgE: <0.1 kU/L
Bahia Grass IgE: <0.1 kU/L
Bermuda Grass IgE: <0.1 kU/L
Cat Dander IgE: <0.1 kU/L
Cedar, Mountain IgE: <0.1 kU/L
Cladosporium Herbarum IgE: <0.1 kU/L
Cocklebur IgE: <0.1 kU/L
Cockroach, American IgE: <0.1 kU/L
Common Silver Birch IgE: <0.1 kU/L
D Farinae IgE: <0.1 kU/L
D Pteronyssinus IgE: <0.1 kU/L
Dog Dander IgE: <0.1 kU/L
Elm, American IgE: <0.1 kU/L
Hickory, White IgE: <0.1 kU/L
IgE (Immunoglobulin E), Serum: 95 IU/mL (ref 6–495)
Johnson Grass IgE: <0.1 kU/L
Kentucky Bluegrass IgE: <0.1 kU/L
Maple/Box Elder IgE: <0.1 kU/L
Mucor Racemosus IgE: <0.1 kU/L
Oak, White IgE: <0.1 kU/L
Penicillium Chrysogen IgE: <0.1 kU/L
Pigweed, Rough IgE: <0.1 kU/L
Plantain, English IgE: <0.1 kU/L
Ragweed, Short IgE: <0.1 kU/L
Setomelanomma Rostrat: <0.1 kU/L
Timothy Grass IgE: <0.1 kU/L
White Mulberry IgE: <0.1 kU/L

## 2022-03-22 NOTE — Telephone Encounter (Signed)
Terri Pita, MD  Terri Cruz, CMA ?No evidence of allergies on the allergen panel.  ? ?Patient is aware of results and voiced her understanding.  ?Nothing further needed.  ? ?

## 2022-03-25 ENCOUNTER — Other Ambulatory Visit (HOSPITAL_COMMUNITY)
Admission: RE | Admit: 2022-03-25 | Discharge: 2022-03-25 | Disposition: A | Discharge status: Home / Self Care | Payer: BC Managed Care – PPO | Source: Ambulatory Visit | Attending: Family Medicine | Admitting: Family Medicine

## 2022-03-25 ENCOUNTER — Encounter: Payer: Self-pay | Admitting: Family Medicine

## 2022-03-25 ENCOUNTER — Ambulatory Visit (INDEPENDENT_AMBULATORY_CARE_PROVIDER_SITE_OTHER): Payer: BC Managed Care – PPO | Admitting: Family Medicine

## 2022-03-25 VITALS — BP 130/80 | HR 64 | Temp 98.4°F | Resp 14 | Ht 66.0 in | Wt 161.0 lb

## 2022-03-25 DIAGNOSIS — Z0000 Encounter for general adult medical examination without abnormal findings: Secondary | ICD-10-CM | POA: Insufficient documentation

## 2022-03-25 DIAGNOSIS — R7309 Other abnormal glucose: Secondary | ICD-10-CM | POA: Diagnosis not present

## 2022-03-25 DIAGNOSIS — Z136 Encounter for screening for cardiovascular disorders: Secondary | ICD-10-CM | POA: Diagnosis not present

## 2022-03-25 DIAGNOSIS — Z13220 Encounter for screening for lipoid disorders: Secondary | ICD-10-CM | POA: Diagnosis not present

## 2022-03-25 DIAGNOSIS — Z124 Encounter for screening for malignant neoplasm of cervix: Secondary | ICD-10-CM | POA: Diagnosis not present

## 2022-03-25 DIAGNOSIS — Z01419 Encounter for gynecological examination (general) (routine) without abnormal findings: Secondary | ICD-10-CM | POA: Diagnosis not present

## 2022-03-25 DIAGNOSIS — Z23 Encounter for immunization: Secondary | ICD-10-CM | POA: Insufficient documentation

## 2022-03-25 NOTE — Assessment & Plan Note (Signed)
Consent received; provided ?Dose 1/2 ?

## 2022-03-25 NOTE — Progress Notes (Signed)
? ? ?I,Terri Cruz,acting as a scribe for Terri Sprout, FNP.,have documented all relevant documentation on the behalf of Terri Sprout, FNP,as directed by  Terri Sprout, FNP while in the presence of Terri Sprout, FNP.  ? ? ?Complete physical exam ? ? ?Patient: Terri Cruz   DOB: 11-17-1961   61 y.o. Female  MRN: [id number] ?Visit Date: 03/25/2022 ? ?Today's healthcare provider: Gwyneth Sprout, FNP  ? ?Chief Complaint  ?Patient presents with  ? Annual Exam  ? ?Subjective  ?  ?Terri Cruz is a 61 y.o. female who presents today for a complete physical exam.  ?She reports consuming a general diet. The patient has a physically strenuous job, but has no regular exercise apart from work.  She generally feels fairly well. She reports sleeping poorly. She does not have additional problems to discuss today.  ? ?Situational stress r/t sister and her health; patient denies additional treatment at this time for insomnia  ? ?HPI  ? ?History reviewed. No pertinent past medical history. ?Past Surgical History:  ?Procedure Laterality Date  ? TUBAL LIGATION    ? ?Social History  ? ?Socioeconomic History  ? Marital status: Married  ?  Spouse name: Not on file  ? Number of children: Not on file  ? Years of education: Not on file  ? Highest education level: Not on file  ?Occupational History  ? Not on file  ?Tobacco Use  ? Smoking status: Former  ?  Packs/day: 0.50  ?  Years: 20.00  ?  Pack years: 10.00  ?  Types: Cigarettes  ?  Quit date: 12/06/1997  ?  Years since quitting: 24.3  ? Smokeless tobacco: Never  ?Substance and Sexual Activity  ? Alcohol use: Yes  ?  Comment: occasionally  ? Drug use: No  ? Sexual activity: Not on file  ?Other Topics Concern  ? Not on file  ?Social History Narrative  ? Not on file  ? ?Social Determinants of Health  ? ?Financial Resource Strain: Not on file  ?Food Insecurity: Not on file  ?Transportation Needs: Not on file  ?Physical Activity: Not on file  ?Stress: Not on file  ?Social  Connections: Not on file  ?Intimate Partner Violence: Not on file  ? ?Family Status  ?Relation Name Status  ? Mother  Deceased at age 71  ? Father  Deceased at age 80  ? Sister  Alive  ? Brother  Alive  ? Ethlyn Daniels  (Not Specified)  ? ?Family History  ?Problem Relation Age of Onset  ? Alcohol abuse Mother   ? Emphysema Mother   ? Leukemia Mother   ? Liver cancer Mother   ? Hypertension Father   ? Stroke Father   ? Breast cancer Sister   ? Graves' disease Sister   ? Breast cancer Paternal Aunt   ? ?Allergies  ?Allergen Reactions  ? Codeine   ?  headache  ? Hydrocodone Bit-Homatrop Mbr   ?  Gi issues  ?  ?Patient Care Team: ?Terri Sprout, FNP as PCP - General (Family Medicine)  ? ?Medications: ?Outpatient Medications Prior to Visit  ?Medication Sig  ? albuterol (VENTOLIN HFA) 108 (90 Base) MCG/ACT inhaler Inhale 2 puffs into the lungs every 4 (four) hours as needed for wheezing or shortness of breath.  ? budesonide-formoterol (SYMBICORT) 160-4.5 MCG/ACT inhaler Inhale 2 puffs into the lungs in the morning and at bedtime.  ? cetirizine (ZYRTEC) 10 MG tablet Take 1  tablet (10 mg total) by mouth daily.  ? meloxicam (MOBIC) 15 MG tablet TAKE 1 TABLET BY MOUTH ONCE DAILY  ? montelukast (SINGULAIR) 10 MG tablet Take 1 tablet (10 mg total) by mouth at bedtime.  ? Spacer/Aero-Holding [facility] DIAMOND) MISC Attach to inhaler  ? [DISCONTINUED] albuterol (PROVENTIL) (2.5 MG/3ML) 0.083% nebulizer solution 2.5 mg   ? ?No facility-administered medications prior to visit.  ? ? ?Review of Systems  ?Constitutional:  Negative for chills, fatigue and fever.  ?HENT:  Positive for tinnitus. Negative for congestion, ear pain, rhinorrhea, sneezing and sore throat.   ?Eyes: Negative.  Negative for pain and redness.  ?Respiratory:  Positive for wheezing. Negative for cough and shortness of breath.   ?Cardiovascular:  Negative for chest pain and leg swelling.  ?Gastrointestinal:  Negative for abdominal pain, blood in stool,  constipation, diarrhea and nausea.  ?Endocrine: Negative for polydipsia and polyphagia.  ?Genitourinary: Negative.  Negative for dysuria, flank pain, hematuria, pelvic pain, vaginal bleeding and vaginal discharge.  ?Musculoskeletal:  Positive for arthralgias and joint swelling. Negative for back pain and gait problem.  ?Skin:  Negative for rash.  ?Neurological: Negative.  Negative for dizziness, tremors, seizures, weakness, light-headedness, numbness and headaches.  ?Hematological:  Negative for adenopathy.  ?Psychiatric/Behavioral: Negative.  Negative for behavioral problems, confusion and dysphoric mood. The patient is not nervous/anxious and is not hyperactive.   ? ?  ? Objective  ? ?  ?BP 130/80 Comment: home  Pulse 64   Temp 98.4 ?F (36.9 ?C) (Oral)   Resp 14   Ht '5\' 6"'$  (1.676 m)   Wt 161 lb (73 kg)   LMP 02/11/2015   SpO2 99% Comment: room air  BMI 25.99 kg/m?  ?  ? ? ?Physical Exam ?Vitals and nursing note reviewed.  ?Constitutional:   ?   General: She is awake. She is not in acute distress. ?   Appearance: Normal appearance. She is well-developed, well-groomed and normal weight. She is not ill-appearing, toxic-appearing or diaphoretic.  ?HENT:  ?   Head: Normocephalic and atraumatic.  ?   Jaw: There is normal jaw occlusion. No trismus, tenderness, swelling or pain on movement.  ?   Right Ear: Hearing, tympanic membrane and external ear normal. There is no impacted cerumen.  ?   Left Ear: Hearing, tympanic membrane and external ear normal. There is no impacted cerumen.  ?   Ears:  ?   Comments: Both ear canals are slightly pink; denies allergies or illness  ?   Nose: Nose normal. No congestion or rhinorrhea.  ?   Right Turbinates: Not enlarged, swollen or pale.  ?   Left Turbinates: Not enlarged, swollen or pale.  ?   Right Sinus: No maxillary sinus tenderness or frontal sinus tenderness.  ?   Left Sinus: No maxillary sinus tenderness or frontal sinus tenderness.  ?   Mouth/Throat:  ?   Lips: Pink.  ?    Mouth: Mucous membranes are moist. No injury.  ?   Tongue: No lesions.  ?   Pharynx: Oropharynx is clear. Uvula midline. No pharyngeal swelling, oropharyngeal exudate, posterior oropharyngeal erythema or uvula swelling.  ?   Tonsils: No tonsillar exudate or tonsillar abscesses.  ?Eyes:  ?   General: Lids are normal. Lids are everted, no foreign bodies appreciated. Vision grossly intact. Gaze aligned appropriately. No allergic shiner or visual field deficit.    ?   Right eye: No discharge.     ?   Left eye:  No discharge.  ?   Extraocular Movements: Extraocular movements intact.  ?   Conjunctiva/sclera: Conjunctivae normal.  ?   Right eye: Right conjunctiva is not injected. No exudate. ?   Left eye: Left conjunctiva is not injected. No exudate. ?   Pupils: Pupils are equal, round, and reactive to light.  ?Neck:  ?   Thyroid: No thyroid mass, thyromegaly or thyroid tenderness.  ?   Vascular: No carotid bruit.  ?   Trachea: Trachea normal.  ?Cardiovascular:  ?   Rate and Rhythm: Normal rate and regular rhythm.  ?   Pulses: Normal pulses.     ?     Carotid pulses are 2+ on the right side and 2+ on the left side. ?     Radial pulses are 2+ on the right side and 2+ on the left side.  ?     Dorsalis pedis pulses are 2+ on the right side and 2+ on the left side.  ?     Posterior tibial pulses are 2+ on the right side and 2+ on the left side.  ?   Heart sounds: Normal heart sounds, S1 normal and S2 normal. No murmur heard. ?  No friction rub. No gallop.  ?Pulmonary:  ?   Effort: Pulmonary effort is normal. No respiratory distress.  ?   Breath sounds: Normal breath sounds and air entry. No stridor. No wheezing, rhonchi or rales.  ?Chest:  ?   Chest wall: No tenderness.  ?   Comments: Breasts: breasts appear normal, no suspicious masses, no skin or nipple changes or axillary nodes, symmetric fibrous changes in both upper outer quadrants, right breast normal without mass, skin or nipple changes or axillary nodes, left breast  normal without mass, skin or nipple changes or axillary nodes, unchanged from previous exams, risk and benefit of breast self-exam was discussed ? ?Abdominal:  ?   General: Abdomen is flat. Bowel sounds are normal. There

## 2022-03-25 NOTE — Assessment & Plan Note (Signed)
Seen on previous blood chemistry ?Will check A1c ?Continue to recommend balanced, lower carb meals. Smaller meal size, adding snacks. Choosing water as drink of choice and increasing purposeful exercise. ? ?

## 2022-03-25 NOTE — Assessment & Plan Note (Signed)
Due for dental ?Due for vision ?PAP completed ?TDAP and Shingles #1 provided ?Things to do to keep yourself healthy  ?- Exercise at least 30-45 minutes a day, 3-4 days a week.  ?- Eat a low-fat diet with lots of fruits and vegetables, up to 7-9 servings per day.  ?- Seatbelts can save your life. Wear them always.  ?- Smoke detectors on every level of your home, check batteries every year.  ?- Eye Doctor - have an eye exam every 1-2 years  ?- Safe sex - if you may be exposed to STDs, use a condom.  ?- Alcohol -  If you drink, do it moderately, less than 2 drinks per day.  ?- [place]. Choose someone to speak for you if you are not able.  ?- Depression is common in our stressful world.If you're feeling down or losing interest in things you normally enjoy, please come in for a visit.  ?- Violence - If anyone is threatening or hurting you, please call immediately. ? ? ?

## 2022-03-25 NOTE — Assessment & Plan Note (Signed)
PAP completed today with HPV co testing ?

## 2022-03-25 NOTE — Assessment & Plan Note (Signed)
Consent received; provided ?

## 2022-03-25 NOTE — Assessment & Plan Note (Signed)
Will check NFLP ?We recommend diet low in saturated fat and regular exercise - 30 min at least 5 times per week ? ?

## 2022-03-26 LAB — LIPID PANEL
Chol/HDL Ratio: 3.4 ratio (ref 0.0–4.4)
Cholesterol, Total: 239 mg/dL — ABNORMAL HIGH (ref 100–199)
HDL: 71 mg/dL (ref >39)
LDL Chol Calc (NIH): 146 mg/dL — ABNORMAL HIGH (ref 0–99)
Triglycerides: 125 mg/dL (ref 0–149)
VLDL Cholesterol Cal: 22 mg/dL (ref 5–40)

## 2022-03-26 LAB — HEMOGLOBIN A1C
Est. average glucose Bld gHb Est-mCnc: 117 mg/dL
Hgb A1c MFr Bld: 5.7 % — ABNORMAL HIGH (ref 4.8–5.6)

## 2022-03-30 LAB — CYTOLOGY - PAP
Chlamydia: NEGATIVE
Comment: NEGATIVE
Comment: NEGATIVE
Comment: NEGATIVE
Comment: NEGATIVE
Comment: NORMAL
Diagnosis: NEGATIVE
HSV1: NEGATIVE
HSV2: NEGATIVE
High risk HPV: NEGATIVE
Neisseria Gonorrhea: NEGATIVE
Trichomonas: NEGATIVE

## 2022-04-20 ENCOUNTER — Ambulatory Visit
Admission: RE | Admit: 2022-04-20 | Discharge: 2022-04-20 | Disposition: A | Discharge status: Home / Self Care | Payer: BC Managed Care – PPO | Source: Ambulatory Visit | Attending: Family Medicine | Admitting: Family Medicine

## 2022-04-20 DIAGNOSIS — Z1231 Encounter for screening mammogram for malignant neoplasm of breast: Secondary | ICD-10-CM | POA: Diagnosis not present

## 2022-04-21 ENCOUNTER — Other Ambulatory Visit: Payer: Self-pay | Admitting: Family Medicine

## 2022-04-21 ENCOUNTER — Encounter: Payer: Self-pay | Admitting: Family Medicine

## 2022-04-21 DIAGNOSIS — N6489 Other specified disorders of breast: Secondary | ICD-10-CM

## 2022-04-21 DIAGNOSIS — R928 Other abnormal and inconclusive findings on diagnostic imaging of breast: Secondary | ICD-10-CM

## 2022-04-27 ENCOUNTER — Other Ambulatory Visit: Payer: Self-pay | Admitting: Family Medicine

## 2022-04-27 DIAGNOSIS — J4541 Moderate persistent asthma with (acute) exacerbation: Secondary | ICD-10-CM

## 2022-04-27 NOTE — Telephone Encounter (Unsigned)
Copied from [place] [phone]. Topic: General - Other >> Apr 27, 2022  3:52 PM Tessa Lerner A wrote: Reason for CRM: Medication Refill - Medication: albuterol (VENTOLIN HFA) 108 (90 Base) MCG/ACT inhaler [[id number]]   budesonide-formoterol (SYMBICORT) 160-4.5 MCG/ACT inhaler [[id number]]   Has the patient contacted their pharmacy? Yes.  The patient has been directed to contact their PCP (Agent: If no, request that the patient contact the pharmacy for the refill. If patient does not wish to contact the pharmacy document the reason why and proceed with request.) (Agent: If yes, when and what did the pharmacy advise?)  Preferred Pharmacy (with phone number or street name): [place], [place] - [address] [place] [address] Phone: [phone] Fax: [phone] Hours: Not open 24 hours   Has the patient been seen for an appointment in the last year OR does the patient have an upcoming appointment? Yes.    Agent: Please be advised that RX refills may take up to 3 business days. We ask that you follow-up with your pharmacy.

## 2022-04-28 MED ORDER — BUDESONIDE-FORMOTEROL FUMARATE 160-4.5 MCG/ACT IN AERO: 2.0000 | INHALATION_SPRAY | Freq: Two times a day (BID) | RESPIRATORY_TRACT | 1 refills | Status: DC

## 2022-04-28 MED ORDER — ALBUTEROL SULFATE HFA 108 (90 BASE) MCG/ACT IN AERS: 2.0000 | INHALATION_SPRAY | RESPIRATORY_TRACT | 0 refills | Status: DC | PRN

## 2022-04-28 NOTE — Telephone Encounter (Signed)
Requested medication (s) are due for refill today: yes  Requested medication (s) are on the active medication list: yes    Last refill: 01/22/22  8g  0 refills  Future visit scheduled yes 05/28/22  Notes to clinic:Historical Provider  Requested Prescriptions  Pending Prescriptions Disp Refills   albuterol (VENTOLIN HFA) 108 (90 Base) MCG/ACT inhaler 8 g 0    Sig: Inhale 2 puffs into the lungs every 4 (four) hours as needed for wheezing or shortness of breath.     Pulmonology:  Beta Agonists 2 Passed - 04/27/2022  5:37 PM      Passed - Last BP in normal range    BP Readings from Last 1 Encounters:  03/25/22 130/80         Passed - Last Heart Rate in normal range    Pulse Readings from Last 1 Encounters:  03/25/22 64         Passed - Valid encounter within last 12 months    Recent Outpatient Visits           1 month ago Annual physical exam   [facility] Tally Joe T, FNP   3 months ago Moderate persistent asthma with exacerbation   [facility] Tally Joe T, FNP   4 months ago Moderate persistent asthma with exacerbation   [facility] Tally Joe T, FNP   4 months ago Mild persistent asthma with acute exacerbation   [facility] Mikey Kirschner, PA-C   1 year ago Hip pain   [place], [place], [place]       Future Appointments             In 3 weeks Martyn Ehrich, NP [facility] [place]              budesonide-formoterol [facility]) 160-4.5 MCG/ACT inhaler 1 each 12    Sig: Inhale 2 puffs into the lungs in the morning and at bedtime.     Pulmonology:  Combination Products Passed - 04/27/2022  5:37 PM      Passed - Valid encounter within last 12 months    Recent Outpatient Visits           1 month ago Annual physical exam   [facility] Tally Joe T, FNP   3 months ago Moderate persistent asthma with exacerbation   [facility]  Tally Joe T, FNP   4 months ago Moderate persistent asthma with exacerbation   [facility] Tally Joe T, FNP   4 months ago Mild persistent asthma with acute exacerbation   [facility] Mikey Kirschner, PA-C   1 year ago Hip pain   [facility] [place] Post, [place]       Future Appointments             In 3 weeks Martyn Ehrich, NP [place]

## 2022-05-11 ENCOUNTER — Ambulatory Visit
Admission: RE | Admit: 2022-05-11 | Discharge: 2022-05-11 | Disposition: A | Discharge status: Home / Self Care | Payer: BC Managed Care – PPO | Source: Ambulatory Visit | Attending: Family Medicine | Admitting: Family Medicine

## 2022-05-11 DIAGNOSIS — R928 Other abnormal and inconclusive findings on diagnostic imaging of breast: Secondary | ICD-10-CM | POA: Diagnosis not present

## 2022-05-11 DIAGNOSIS — N6489 Other specified disorders of breast: Secondary | ICD-10-CM | POA: Diagnosis not present

## 2022-05-11 DIAGNOSIS — R922 Inconclusive mammogram: Secondary | ICD-10-CM | POA: Diagnosis not present

## 2022-05-17 ENCOUNTER — Ambulatory Visit: Payer: BC Managed Care – PPO | Attending: Pulmonary Disease

## 2022-05-17 DIAGNOSIS — J45909 Unspecified asthma, uncomplicated: Secondary | ICD-10-CM | POA: Diagnosis not present

## 2022-05-17 LAB — PULMONARY FUNCTION TEST ARMC ONLY
DL/VA % pred: 77 %
DL/VA: 3.2 ml/min/mmHg/L
DLCO unc % pred: 79 %
DLCO unc: 16.98 ml/min/mmHg
FEF 25-75 Post: 2.02 L/sec
FEF 25-75 Pre: 0.89 L/sec
FEF2575-%Change-Post: 127 %
FEF2575-%Pred-Post: 84 %
FEF2575-%Pred-Pre: 37 %
FEV1-%Change-Post: 30 %
FEV1-%Pred-Post: 80 %
FEV1-%Pred-Pre: 61 %
FEV1-Post: 2.17 L
FEV1-Pre: 1.66 L
FEV1FVC-%Change-Post: 6 %
FEV1FVC-%Pred-Pre: 80 %
FEV6-%Change-Post: 23 %
FEV6-%Pred-Post: 94 %
FEV6-%Pred-Pre: 76 %
FEV6-Post: 3.18 L
FEV6-Pre: 2.57 L
FEV6FVC-%Change-Post: 0 %
FEV6FVC-%Pred-Post: 101 %
FEV6FVC-%Pred-Pre: 100 %
FVC-%Change-Post: 22 %
FVC-%Pred-Post: 92 %
FVC-%Pred-Pre: 75 %
FVC-Post: 3.25 L
FVC-Pre: 2.65 L
Post FEV1/FVC ratio: 67 %
Post FEV6/FVC ratio: 98 %
Pre FEV1/FVC ratio: 63 %
Pre FEV6/FVC Ratio: 97 %
RV % pred: 112 %
RV: 2.4 L
TLC % pred: 106 %
TLC: 5.69 L

## 2022-05-17 MED ORDER — ALBUTEROL SULFATE (2.5 MG/3ML) 0.083% IN NEBU
2.5000 mg | INHALATION_SOLUTION | Freq: Once | RESPIRATORY_TRACT | Status: AC
  Administered 2022-05-17: 2.5 mg via RESPIRATORY_TRACT
  Filled 2022-05-17: qty 3

## 2022-05-21 ENCOUNTER — Ambulatory Visit: Payer: BC Managed Care – PPO | Admitting: Primary Care

## 2022-05-21 NOTE — Progress Notes (Deleted)
$'@Patient'U$  ID: Terri Cruz, female    DOB: 10-15-1961, 61 y.o.   MRN: [id number]  No chief complaint on file.   Referring provider: Gwyneth Sprout, FNP  HPI: 61 year old female, former smoker quit 1999 (10-pack-year history).  Past medical history significant for moderate persistent asthma, allergic rhinitis.  Patient of Dr. Patsey Berthold, seen for initial consult on 03/18/2022 for asthma.  Maintained on Symbicort, as needed albuterol and Singulair.  For allergy testing and pulmonary function testing.  05/21/2022 Patient presents today for 60-monthfollow-up.  Pulmonary function testing in June showed moderate obstructive airway disease with significant bronchodilator response, air trapping and hyperinflation  Allergy panel was negative.  Eosinophil absolute were 300 and April 2023.  Allergies  Allergen Reactions   Codeine     headache   Hydrocodone Bit-Homatrop Mbr     Gi issues    Immunization History  Administered Date(s) Administered   Influenza Split 10/30/2007, 08/10/2012   Influenza,inj,Quad PF,6+ Mos 10/14/2021   Influenza-Unspecified 09/05/2018   Pneumococcal Polysaccharide-23 09/26/2012   Tdap 03/25/2022   Zoster Recombinat (Shingrix) 03/25/2022    No past medical history on file.  Tobacco History: Social History   Tobacco Use  Smoking Status Former   Packs/day: 0.50   Years: 20.00   Total pack years: 10.00   Types: Cigarettes   Quit date: 12/06/1997   Years since quitting: 24.4  Smokeless Tobacco Never   Counseling given: Not Answered   Outpatient Medications Prior to Visit  Medication Sig Dispense Refill   albuterol (VENTOLIN HFA) 108 (90 Base) MCG/ACT inhaler Inhale 2 puffs into the lungs every 4 (four) hours as needed for wheezing or shortness of breath. 8 g 0   budesonide-formoterol (SYMBICORT) 160-4.5 MCG/ACT inhaler Inhale 2 puffs into the lungs in the morning and at bedtime. 10.2 g 1   cetirizine (ZYRTEC) 10 MG tablet Take 1 tablet (10 mg total)  by mouth daily. 30 tablet 11   meloxicam (MOBIC) 15 MG tablet TAKE 1 TABLET BY MOUTH ONCE DAILY 30 tablet 0   montelukast (SINGULAIR) 10 MG tablet Take 1 tablet (10 mg total) by mouth at bedtime. 30 tablet 3   Spacer/Aero-Holding [place] (OPTICHAMBER DIAMOND) MISC Attach to inhaler 1 each 1   No facility-administered medications prior to visit.      Review of Systems  Review of Systems   Physical Exam  LMP 02/11/2015  Physical Exam   Lab Results:  CBC    Component Value Date/Time   WBC 9.8 03/18/2022 1648   RBC 4.08 03/18/2022 1648   HGB 12.3 03/18/2022 1648   HGB 12.1 02/11/2020 0000   HCT 38.2 03/18/2022 1648   HCT 36.9 02/11/2020 0000   PLT 322 03/18/2022 1648   PLT 351 02/11/2020 0000   MCV 93.6 03/18/2022 1648   MCV 92 02/11/2020 0000   MCH 30.1 03/18/2022 1648   MCHC 32.2 03/18/2022 1648   RDW 13.6 03/18/2022 1648   RDW 13.2 02/11/2020 0000   LYMPHSABS 3.2 03/18/2022 1648   LYMPHSABS 2.5 02/11/2020 0000   MONOABS 0.9 03/18/2022 1648   EOSABS 0.3 03/18/2022 1648   EOSABS 0.1 02/11/2020 0000   BASOSABS 0.1 03/18/2022 1648   BASOSABS 0.0 02/11/2020 0000    BMET    Component Value Date/Time   NA 137 01/22/2022 0844   NA 140 02/11/2020 0000   K 4.2 01/22/2022 0844   CL 100 01/22/2022 0844   CO2 28 01/22/2022 0844   GLUCOSE 139 (H) 01/22/2022 07902  BUN 10 01/22/2022 0844   BUN 10 02/11/2020 0000   CREATININE 0.56 01/22/2022 0844   CALCIUM 9.9 01/22/2022 0844   GFRNONAA >60 01/22/2022 0844   GFRAA 112 02/11/2020 0000    BNP No results found for: "BNP"  ProBNP No results found for: "PROBNP"  Imaging: Pulmonary Function Test [facility] Only  Result Date: 05/17/2022 Spirometry Data Is Acceptable and Reproducible Moderate Obstructive Airways Disease with Significant Broncho-Dilator Response +air trapping (increased RV) and +Hyperinflation Consider outpatient Pulmonary Consultation if needed Clinical Correlation Advised   MM DIAG BREAST TOMO UNI  LEFT  Result Date: 05/11/2022 CLINICAL DATA:  Recall from screening mammography, possible asymmetry in the slight outer breast at middle depth visible only on the CC view. EXAM: DIGITAL DIAGNOSTIC UNILATERAL LEFT MAMMOGRAM WITH TOMOSYNTHESIS AND CAD; ULTRASOUND LEFT BREAST LIMITED TECHNIQUE: Left digital diagnostic mammography and breast tomosynthesis was performed. The images were evaluated with computer-aided detection.; Targeted ultrasound examination of the left breast was performed. COMPARISON:  Previous exam(s). ACR Breast Density Category c: The breast tissue is heterogeneously dense, which may obscure small masses. FINDINGS: Spot-compression CC view of the area of concern and full field mediolateral and medially rolled CC views were obtained. The asymmetry persists but partially disperses on the spot compression view. There is no associated architectural distortion. The asymmetry is not conspicuous on the full field mediolateral view or on the medially rolled CC view, likely indicating overlapping fibroglandular tissue. Targeted ultrasound is performed in the outer breast, demonstrating normal dense fibroglandular tissue. No cyst, solid mass or abnormal acoustic shadowing is identified. IMPRESSION: No mammographic or sonographic evidence of malignancy involving the LEFT breast. RECOMMENDATION: Screening mammogram in one year.(Code:SM-B-01Y) I have discussed the findings and recommendations with the patient. If applicable, a reminder letter will be sent to the patient regarding the next appointment. BI-RADS CATEGORY  1: Negative. Electronically Signed   By: Evangeline Dakin M.D.   On: 05/11/2022 15:21   US BREAST LTD UNI LEFT INC AXILLA  Result Date: 05/11/2022 CLINICAL DATA:  Recall from screening mammography, possible asymmetry in the slight outer breast at middle depth visible only on the CC view. EXAM: DIGITAL DIAGNOSTIC UNILATERAL LEFT MAMMOGRAM WITH TOMOSYNTHESIS AND CAD; ULTRASOUND LEFT BREAST  LIMITED TECHNIQUE: Left digital diagnostic mammography and breast tomosynthesis was performed. The images were evaluated with computer-aided detection.; Targeted ultrasound examination of the left breast was performed. COMPARISON:  Previous exam(s). ACR Breast Density Category c: The breast tissue is heterogeneously dense, which may obscure small masses. FINDINGS: Spot-compression CC view of the area of concern and full field mediolateral and medially rolled CC views were obtained. The asymmetry persists but partially disperses on the spot compression view. There is no associated architectural distortion. The asymmetry is not conspicuous on the full field mediolateral view or on the medially rolled CC view, likely indicating overlapping fibroglandular tissue. Targeted ultrasound is performed in the outer breast, demonstrating normal dense fibroglandular tissue. No cyst, solid mass or abnormal acoustic shadowing is identified. IMPRESSION: No mammographic or sonographic evidence of malignancy involving the LEFT breast. RECOMMENDATION: Screening mammogram in one year.(Code:SM-B-01Y) I have discussed the findings and recommendations with the patient. If applicable, a reminder letter will be sent to the patient regarding the next appointment. BI-RADS CATEGORY  1: Negative. Electronically Signed   By: Evangeline Dakin M.D.   On: 05/11/2022 15:21    Assessment & Plan:   No problem-specific Assessment & Plan notes found for this encounter.     Martyn Ehrich, NP 05/21/2022

## 2022-05-26 ENCOUNTER — Ambulatory Visit: Payer: BC Managed Care – PPO | Admitting: Family Medicine

## 2022-05-28 ENCOUNTER — Ambulatory Visit: Payer: BC Managed Care – PPO | Admitting: Family Medicine

## 2022-05-29 ENCOUNTER — Other Ambulatory Visit: Payer: Self-pay | Admitting: Family Medicine

## 2022-06-04 ENCOUNTER — Ambulatory Visit (INDEPENDENT_AMBULATORY_CARE_PROVIDER_SITE_OTHER): Payer: BC Managed Care – PPO | Admitting: Family Medicine

## 2022-06-04 DIAGNOSIS — Z23 Encounter for immunization: Secondary | ICD-10-CM

## 2022-06-04 NOTE — Progress Notes (Signed)
Patient is here for second dose of shingrix.

## 2022-08-02 ENCOUNTER — Ambulatory Visit (INDEPENDENT_AMBULATORY_CARE_PROVIDER_SITE_OTHER): Payer: BC Managed Care – PPO | Admitting: Adult Health

## 2022-08-02 ENCOUNTER — Encounter: Payer: Self-pay | Admitting: Adult Health

## 2022-08-02 DIAGNOSIS — J302 Other seasonal allergic rhinitis: Secondary | ICD-10-CM | POA: Diagnosis not present

## 2022-08-02 DIAGNOSIS — J4541 Moderate persistent asthma with (acute) exacerbation: Secondary | ICD-10-CM

## 2022-08-02 DIAGNOSIS — J45909 Unspecified asthma, uncomplicated: Secondary | ICD-10-CM | POA: Insufficient documentation

## 2022-08-02 DIAGNOSIS — J4530 Mild persistent asthma, uncomplicated: Secondary | ICD-10-CM | POA: Diagnosis not present

## 2022-08-02 MED ORDER — ALBUTEROL SULFATE HFA 108 (90 BASE) MCG/ACT IN AERS: INHALATION_SPRAY | RESPIRATORY_TRACT | 2 refills | Status: DC

## 2022-08-02 MED ORDER — BUDESONIDE-FORMOTEROL FUMARATE 160-4.5 MCG/ACT IN AERO: 2.0000 | INHALATION_SPRAY | Freq: Two times a day (BID) | RESPIRATORY_TRACT | 5 refills | Status: DC

## 2022-08-02 NOTE — Progress Notes (Signed)
$'@Patient'B$  ID: Barnet Pall, female    DOB: 1961/03/04, 62 y.o.   MRN: [id number]  Chief Complaint  Patient presents with   Follow-up    Referring provider: Gwyneth Sprout, FNP  HPI: 61 year old female followed for asthma.  TEST/EVENTS :  PFTs May 17, 2022 showed moderate airflow obstruction with significant reversibility FEV1 was 80%, ratio 67, FVC 92%, positive bronchodilator response (30% change), DLCO 79%.  Chest x-ray December 28, 2021 showed no acute process.  Allergy panel April 2023 and negative, IgE 95, absolute eosinophil count 300    08/02/2022 Follow up : Asthma  Patient returns for a 42-monthfollow-up.  Patient was seen last visit for pulmonary consult to establish for asthma Patient says overall she is doing well.  Breathing has been at baseline.  Denies any increased cough or wheezing.  No increased albuterol use.  She remains on Symbicort. Takes once daily . No longer taking Singulair . Takes Zyrtec As needed . Feels Asthma is doing well. Pulmonary function testing in June showed moderate airflow obstruction with significant reversibility. Remains active , works fulltime.  Got flu shot this month.   Allergies  Allergen Reactions   Codeine     headache   Hydrocodone Bit-Homatrop Mbr     Gi issues    Immunization History  Administered Date(s) Administered   Influenza Split 10/30/2007, 08/10/2012   Influenza,inj,Quad PF,6+ Mos 10/14/2021   Influenza-Unspecified 09/05/2018   Pneumococcal Polysaccharide-23 09/26/2012   Tdap 03/25/2022   Zoster Recombinat (Shingrix) 03/25/2022, 06/04/2022    History reviewed. No pertinent past medical history.  Tobacco History: Social History   Tobacco Use  Smoking Status Former   Packs/day: 0.50   Years: 20.00   Total pack years: 10.00   Types: Cigarettes   Quit date: 12/06/1997   Years since quitting: 24.6  Smokeless Tobacco Never   Counseling given: Not Answered   Outpatient Medications Prior to Visit   Medication Sig Dispense Refill   cetirizine (ZYRTEC) 10 MG tablet Take 1 tablet (10 mg total) by mouth daily. 30 tablet 11   meloxicam (MOBIC) 15 MG tablet TAKE 1 TABLET BY MOUTH ONCE DAILY 30 tablet 0   montelukast (SINGULAIR) 10 MG tablet Take 1 tablet (10 mg total) by mouth at bedtime. 30 tablet 3   Spacer/Aero-Holding Chambers (OPTICHAMBER DIAMOND) MISC Attach to inhaler 1 each 1   albuterol (VENTOLIN HFA) 108 (90 Base) MCG/ACT inhaler INHALE 2 PUFFS BY MOUTH EVERY 4 HOURS AS NEEDED FOR WHEEZING FOR SHORTNESS OF BREATH 9 g 0   budesonide-formoterol (SYMBICORT) 160-4.5 MCG/ACT inhaler Inhale 2 puffs into the lungs in the morning and at bedtime. 10.2 g 1   No facility-administered medications prior to visit.     Review of Systems:   Constitutional:   No  weight loss, night sweats,  Fevers, chills, fatigue, or  lassitude.  HEENT:   No headaches,  Difficulty swallowing,  Tooth/dental problems, or  Sore throat,                No sneezing, itching, ear ache,  +nasal congestion, post nasal drip,   CV:  No chest pain,  Orthopnea, PND, swelling in lower extremities, anasarca, dizziness, palpitations, syncope.   GI  No heartburn, indigestion, abdominal pain, nausea, vomiting, diarrhea, change in bowel habits, loss of appetite, bloody stools.   Resp: No shortness of breath with exertion or at rest.  No excess mucus, no productive cough,  No non-productive cough,  No coughing up of blood.  No change in color of mucus.  No wheezing.  No chest wall deformity  Skin: no rash or lesions.  GU: no dysuria, change in color of urine, no urgency or frequency.  No flank pain, no hematuria   MS:  No joint pain or swelling.  No decreased range of motion.  No back pain.    Physical Exam  BP 130/80 (BP Location: Right Arm, Patient Position: Sitting, Cuff Size: Normal)   Pulse 70   Temp 98.2 F (36.8 C) (Oral)   Ht '5\' 6"'$  (1.676 m)   Wt 165 lb 3.2 oz (74.9 kg)   LMP 02/11/2015   SpO2 97%   BMI  26.66 kg/m   GEN: A/Ox3; pleasant , NAD, well nourished    HEENT:  [place]/AT,   NOSE-clear, THROAT-clear, no lesions, no postnasal drip or exudate noted.   NECK:  Supple w/ fair ROM; no JVD; normal carotid impulses w/o bruits; no thyromegaly or nodules palpated; no lymphadenopathy.    RESP  Clear  P & A; w/o, wheezes/ rales/ or rhonchi. no accessory muscle use, no dullness to percussion  CARD:  RRR, no m/r/g, no peripheral edema, pulses intact, no cyanosis or clubbing.  GI:   Soft & nt; nml bowel sounds; no organomegaly or masses detected.   Musco: Warm bil, no deformities or joint swelling noted.   Neuro: alert, no focal deficits noted.    Skin: Warm, no lesions or rashes    Lab Results:  CBC    Component Value Date/Time   WBC 9.8 03/18/2022 1648   RBC 4.08 03/18/2022 1648   HGB 12.3 03/18/2022 1648   HGB 12.1 02/11/2020 0000   HCT 38.2 03/18/2022 1648   HCT 36.9 02/11/2020 0000   PLT 322 03/18/2022 1648   PLT 351 02/11/2020 0000   MCV 93.6 03/18/2022 1648   MCV 92 02/11/2020 0000   MCH 30.1 03/18/2022 1648   MCHC 32.2 03/18/2022 1648   RDW 13.6 03/18/2022 1648   RDW 13.2 02/11/2020 0000   LYMPHSABS 3.2 03/18/2022 1648   LYMPHSABS 2.5 02/11/2020 0000   MONOABS 0.9 03/18/2022 1648   EOSABS 0.3 03/18/2022 1648   EOSABS 0.1 02/11/2020 0000   BASOSABS 0.1 03/18/2022 1648   BASOSABS 0.0 02/11/2020 0000    BMET    Component Value Date/Time   NA 137 01/22/2022 0844   NA 140 02/11/2020 0000   K 4.2 01/22/2022 0844   CL 100 01/22/2022 0844   CO2 28 01/22/2022 0844   GLUCOSE 139 (H) 01/22/2022 0844   BUN 10 01/22/2022 0844   BUN 10 02/11/2020 0000   CREATININE 0.56 01/22/2022 0844   CALCIUM 9.9 01/22/2022 0844   GFRNONAA >60 01/22/2022 0844   GFRAA 112 02/11/2020 0000    BNP No results found for: "BNP"  ProBNP No results found for: "PROBNP"  Imaging: No results found.       Latest Ref Rng & Units 05/17/2022    3:48 PM  PFT Results  FVC-Pre L 2.65    FVC-Predicted Pre % 75   FVC-Post L 3.25   FVC-Predicted Post % 92   Pre FEV1/FVC % % 63   Post FEV1/FCV % % 67   FEV1-Pre L 1.66   FEV1-Predicted Pre % 61   FEV1-Post L 2.17   DLCO uncorrected ml/min/mmHg 16.98   DLCO UNC% % 79   DLVA Predicted % 77   TLC L 5.69   TLC % Predicted % 106   RV % Predicted % 112  No results found for: "NITRICOXIDE"      Assessment & Plan:   Asthma Mild persistent asthma doing well on current regimen.  Patient is able to use Symbicort 2 puffs daily.  No longer taking Singulair.  Discussed asthma action plan.  Control for triggers.  Plan  Patient Instructions  Continue on Symbicort 2 puffs daily, rinse after use.  Zyrtec '10mg'$  daily as needed.  Albuterol inhaler As needed   Asthma action plan discussed Follow-up in 1 year with Dr. Patsey Berthold and as needed     Allergic rhinitis, seasonal Zyrtec as needed.     Rexene Edison, NP 08/02/2022

## 2022-08-02 NOTE — Assessment & Plan Note (Signed)
Zyrtec as needed 

## 2022-08-02 NOTE — Assessment & Plan Note (Signed)
Mild persistent asthma doing well on current regimen.  Patient is able to use Symbicort 2 puffs daily.  No longer taking Singulair.  Discussed asthma action plan.  Control for triggers.  Plan  Patient Instructions  Continue on Symbicort 2 puffs daily, rinse after use.  Zyrtec '10mg'$  daily as needed.  Albuterol inhaler As needed   Asthma action plan discussed Follow-up in 1 year with Dr. Patsey Berthold and as needed

## 2022-08-02 NOTE — Patient Instructions (Addendum)
Continue on Symbicort 2 puffs daily, rinse after use.  Zyrtec '10mg'$  daily as needed.  Albuterol inhaler As needed   Asthma action plan discussed Follow-up in 1 year with Dr. Patsey Berthold and as needed

## 2022-08-03 NOTE — Progress Notes (Signed)
Agree with the details of the visit as noted by Tammy Parrett, NP.  C. Laura Merilynn Haydu, MD [facility] 

## 2022-08-11 ENCOUNTER — Ambulatory Visit: Payer: BC Managed Care – PPO | Admitting: Dermatology

## 2022-08-17 ENCOUNTER — Ambulatory Visit (INDEPENDENT_AMBULATORY_CARE_PROVIDER_SITE_OTHER): Payer: BC Managed Care – PPO | Admitting: Dermatology

## 2022-08-17 ENCOUNTER — Encounter: Payer: Self-pay | Admitting: Dermatology

## 2022-08-17 DIAGNOSIS — L820 Inflamed seborrheic keratosis: Secondary | ICD-10-CM | POA: Diagnosis not present

## 2022-08-17 DIAGNOSIS — L918 Other hypertrophic disorders of the skin: Secondary | ICD-10-CM | POA: Diagnosis not present

## 2022-08-17 NOTE — Progress Notes (Signed)
   Follow-Up Visit   Subjective  Terri Cruz is a 61 y.o. female who presents for the following: lesion (Was concerned with large mole on right side of neck. Was smaller at last visit. Grew quickly after that visit. Was large, dry, cracked. [place] off 3 days ago. No family Hx of MM).  The patient has spots, moles and lesions to be evaluated, some may be new or changing and the patient has concerns that these could be cancer.   The following portions of the chart were reviewed this encounter and updated as appropriate:  Tobacco  Allergies  Meds  Problems  Med Hx  Surg Hx  Fam Hx      Review of Systems: No other skin or systemic complaints except as noted in HPI or Assessment and Plan.   Objective  Well appearing patient in no apparent distress; mood and affect are within normal limits.  A focused examination was performed including face, neck. Relevant physical exam findings are noted in the Assessment and Plan.  Right Neck - Anterior Erythematous keratotic or waxy stuck-on papule or plaque.   Assessment & Plan  Inflamed seborrheic keratosis Right Neck - Anterior  Benign appearing.  Symptomatic, irritating.   Patient deferred treatment at this time. RTC if bothersome in future   Acrochordons (Skin Tags) - Fleshy, skin-colored pedunculated papules - Benign appearing.  - Observe. - If desired, they can be removed with an in office procedure that is not covered by insurance. - Please call the clinic if you notice any new or changing lesions.   Return if symptoms worsen or fail to improve.  I, Emelia Salisbury, CMA, am acting as scribe for Forest Gleason, MD.  Documentation: I have reviewed the above documentation for accuracy and completeness, and I agree with the above.  Forest Gleason, MD

## 2022-08-17 NOTE — Patient Instructions (Addendum)
Recommend daily broad spectrum sunscreen SPF 30+ to sun-exposed areas, reapply every 2 hours as needed. Call for new or changing lesions.  Staying in the shade or wearing long sleeves, sun glasses (UVA+UVB protection) and wide brim hats (4-inch brim around the entire circumference of the hat) are also recommended for sun protection.    Seborrheic Keratosis  What causes seborrheic keratoses? Seborrheic keratoses are harmless, common skin growths that first appear during adult life.  As time goes by, more growths appear.  Some people may develop a large number of them.  Seborrheic keratoses appear on both covered and uncovered body parts.  They are not caused by sunlight.  The tendency to develop seborrheic keratoses can be inherited.  They vary in color from skin-colored to gray, brown, or even black.  They can be either smooth or have a rough, warty surface.   Seborrheic keratoses are superficial and look as if they were stuck on the skin.  Under the microscope this type of keratosis looks like layers upon layers of skin.  That is why at times the top layer may seem to fall off, but the rest of the growth remains and re-grows.    Treatment Seborrheic keratoses do not need to be treated, but can easily be removed in the office.  Seborrheic keratoses often cause symptoms when they rub on clothing or jewelry.  Lesions can be in the way of shaving.  If they become inflamed, they can cause itching, soreness, or burning.  Removal of a seborrheic keratosis can be accomplished by freezing, burning, or surgery. If any spot bleeds, scabs, or grows rapidly, please return to have it checked, as these can be an indication of a skin cancer.  Due to recent changes in healthcare laws, you may see results of your pathology and/or laboratory studies on MyChart before the doctors have had a chance to review them. We understand that in some cases there may be results that are confusing or concerning to you. Please  understand that not all results are received at the same time and often the doctors may need to interpret multiple results in order to provide you with the best plan of care or course of treatment. Therefore, we ask that you please give [place] 2 business days to thoroughly review all your results before contacting the office for clarification. Should we see a critical lab result, you will be contacted sooner.   If You Need Anything After Your Visit  If you have any questions or concerns for your doctor, please call our main line at [phone] and press option 4 to reach your doctor's medical assistant. If no one answers, please leave a voicemail as directed and we will return your call as soon as possible. Messages left after 4 pm will be answered the following business day.   You may also send [place] a message via [place]. We typically respond to MyChart messages within 1-2 business days.  For prescription refills, please ask your pharmacy to contact our office. Our fax number is [phone].  If you have an urgent issue when the clinic is closed that cannot wait until the next business day, you can page your doctor at the number below.    Please note that while we do our best to be available for urgent issues outside of office hours, we are not available 24/7.   If you have an urgent issue and are unable to reach [place], you may choose to seek medical care at your doctor's  your doctor's office, retail clinic, urgent care center, or emergency room.  If you have a medical emergency, please immediately call 911 or go to the emergency department.  Pager Numbers  - Dr. Kowalski: [phone]  - Dr. Moye: [phone]  - Dr. Stewart: [phone]  In the event of inclement weather, please call our main line at [phone] for an update on the status of any delays or closures.  Dermatology Medication Tips: Please keep the boxes that topical medications come in in order to help keep track of the instructions about  where and how to use these. Pharmacies typically print the medication instructions only on the boxes and not directly on the medication tubes.   If your medication is too expensive, please contact our office at [phone] option 4 or send us a message through MyChart.   We are unable to tell what your co-pay for medications will be in advance as this is different depending on your insurance coverage. However, we may be able to find a substitute medication at lower cost or fill out paperwork to get insurance to cover a needed medication.   If a prior authorization is required to get your medication covered by your insurance company, please allow us 1-2 business days to complete this process.  Drug prices often vary depending on where the prescription is filled and some pharmacies may offer cheaper prices.  The website www.goodrx.com contains coupons for medications through different pharmacies. The prices here do not account for what the cost may be with help from insurance (it may be cheaper with your insurance), but the website can give you the price if you did not use any insurance.  - You can print the associated coupon and take it with your prescription to the pharmacy.  - You may also stop by our office during regular business hours and pick up a GoodRx coupon card.  - If you need your prescription sent electronically to a different pharmacy, notify our office through [place] MyChart or by phone at [phone] option 4.     Si Usted Necesita Algo Despus de Su Visita  Tambin puede enviarnos un mensaje a travs de MyChart. Por lo general respondemos a los mensajes de MyChart en el transcurso de 1 a 2 das hbiles.  Para renovar recetas, por favor pida a su farmacia que se ponga en contacto con nuestra oficina. Nuestro nmero de fax es el [phone].  Si tiene un asunto urgente cuando la clnica est cerrada y que no puede esperar hasta el siguiente da hbil, puede  llamar/localizar a su doctor(a) al nmero que aparece a continuacin.   Por favor, tenga en cuenta que aunque hacemos todo lo posible para estar disponibles para asuntos urgentes fuera del horario de oficina, no estamos disponibles las 24 horas del da, los 7 das de la semana.   Si tiene un problema urgente y no puede comunicarse con nosotros, puede optar por buscar atencin mdica  en el consultorio de su doctor(a), en una clnica privada, en un centro de atencin urgente o en una sala de emergencias.  Si tiene una emergencia mdica, por favor llame inmediatamente al 911 o vaya a la sala de emergencias.  Nmeros de bper  - Dr. Kowalski: [phone]  - Dra. Moye: [phone]  - Dra. Stewart: [phone]  En caso de inclemencias del tiempo, por favor llame a nuestra lnea principal al [phone] para una actualizacin sobre el estado de cualquier retraso o cierre.  Consejos para la medicacin en dermatologa:   guarde las cajas en las que vienen los medicamentos de uso tpico para ayudarle a seguir las instrucciones sobre dnde y cmo usarlos. Las farmacias generalmente imprimen las instrucciones del medicamento slo en las cajas y no directamente en los tubos del [place].   Si su medicamento es muy caro, por favor, pngase en contacto con Zigmund Daniel llamando al [phone] y presione la opcin 4 o envenos un mensaje a travs de Pharmacist, community.   No podemos decirle cul ser su copago por los medicamentos por adelantado ya que esto es diferente dependiendo de la cobertura de su seguro. Sin embargo, es posible que podamos encontrar un medicamento sustituto a Electrical engineer un formulario para que el seguro cubra el medicamento que se considera necesario.   Si se requiere una autorizacin previa para que su compaa de seguros [place] su medicamento, por favor permtanos de 1 a 2 das hbiles para completar este proceso.  Los precios de los medicamentos varan con  frecuencia dependiendo del Environmental consultant de dnde se surte la receta y alguna farmacias pueden ofrecer precios ms baratos.  El sitio web www.goodrx.com tiene cupones para medicamentos de Airline pilot. Los precios aqu no tienen en cuenta lo que podra costar con la ayuda del seguro (puede ser ms barato con su seguro), pero el sitio web puede darle el precio si no utiliz Research scientist (physical sciences).  - Puede imprimir el cupn correspondiente y llevarlo con su receta a la farmacia.  - Tambin puede pasar por nuestra oficina durante el horario de atencin regular y Charity fundraiser una tarjeta de cupones de GoodRx.  - Si necesita que su receta se enve electrnicamente a una farmacia diferente, informe a nuestra oficina a travs de MyChart de [place] o por telfono llamando al [phone] y presione la opcin 4.

## 2022-08-21 IMAGING — MG MM DIGITAL DIAGNOSTIC UNILAT*L* W/ TOMO W/ CAD
6 series · 6 of 18 positions shown · non-contrast
Comparison: Previous exam(s).

CLINICAL DATA: Recall from screening mammography, possible
asymmetry in the slight outer breast at middle depth visible only on
the CC view.

EXAM:
DIGITAL DIAGNOSTIC UNILATERAL LEFT MAMMOGRAM WITH TOMOSYNTHESIS AND
CAD; ULTRASOUND LEFT BREAST LIMITED
TECHNIQUE: Left digital diagnostic mammography and breast tomosynthesis was
performed. The images were evaluated with computer-aided detection.;
Targeted ultrasound examination of the left breast was performed.

[L ML synth-2D]
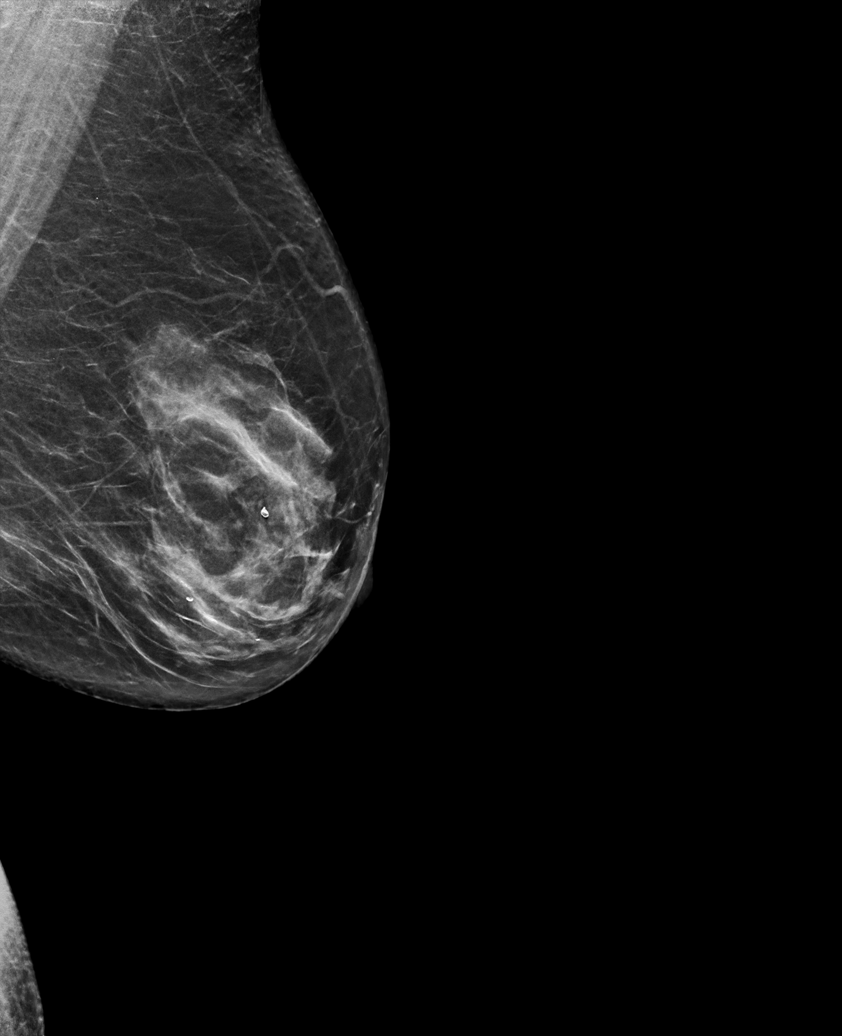

[L CC synth-2D (1 of 2)]
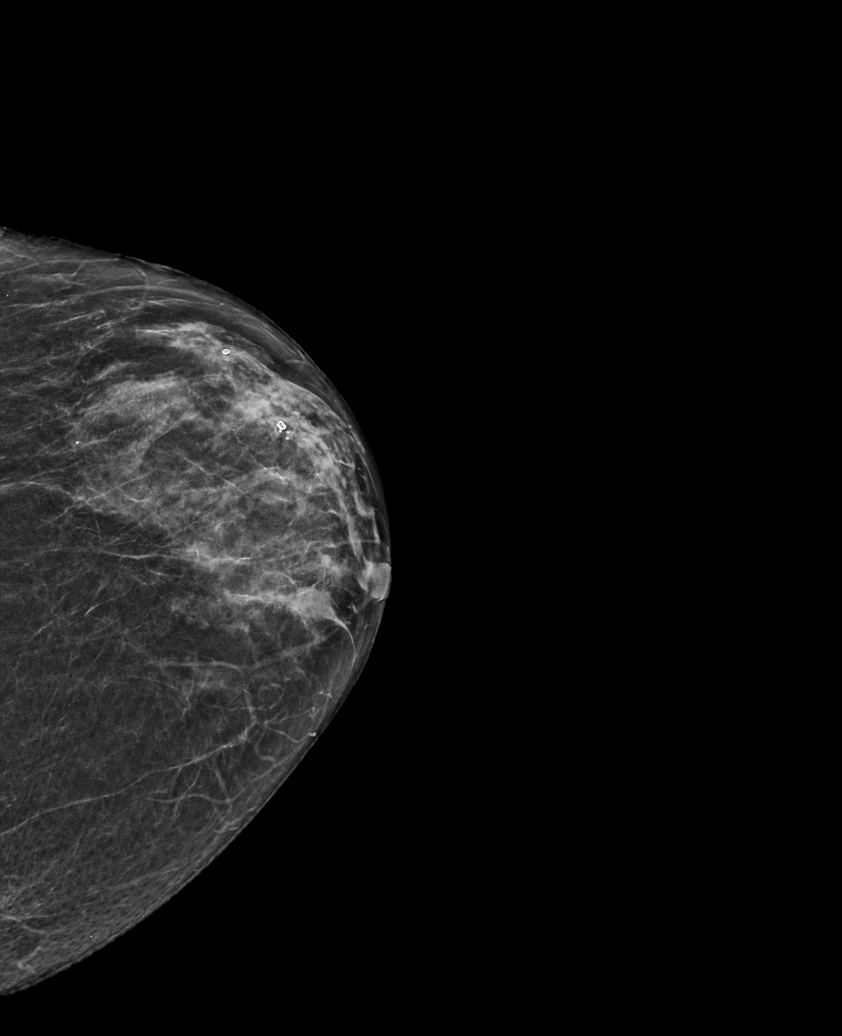

[L CC synth-2D (2 of 2)]
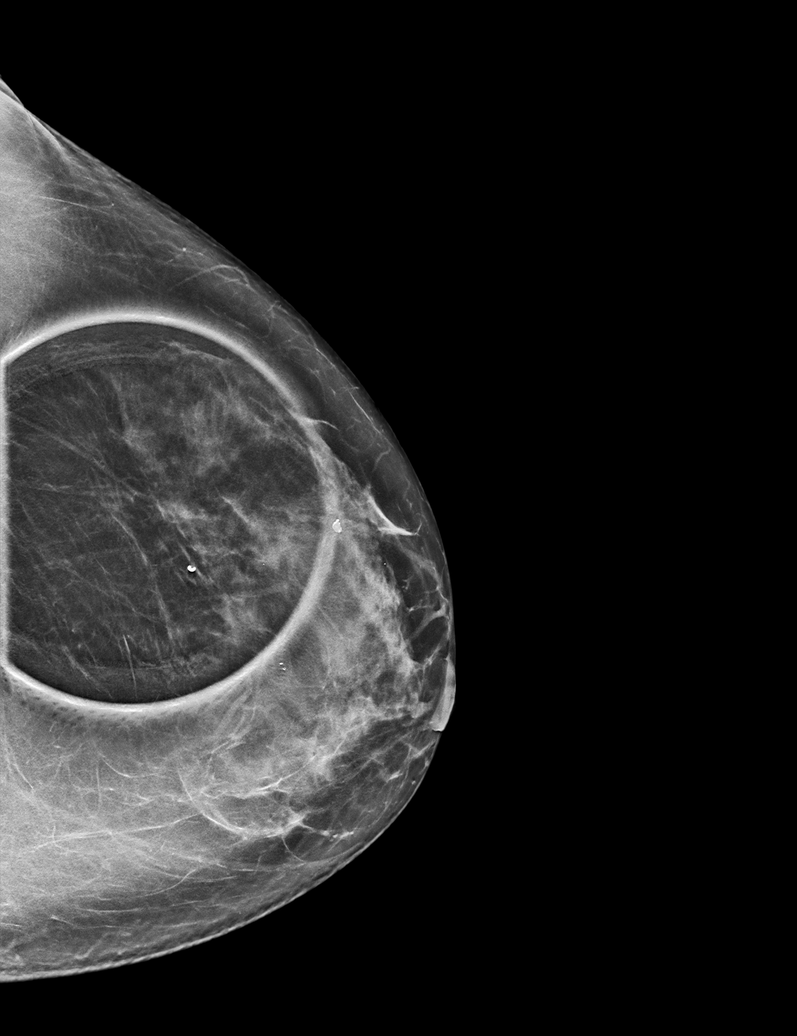

[L ML tomo · tomo slice 35/69.0]
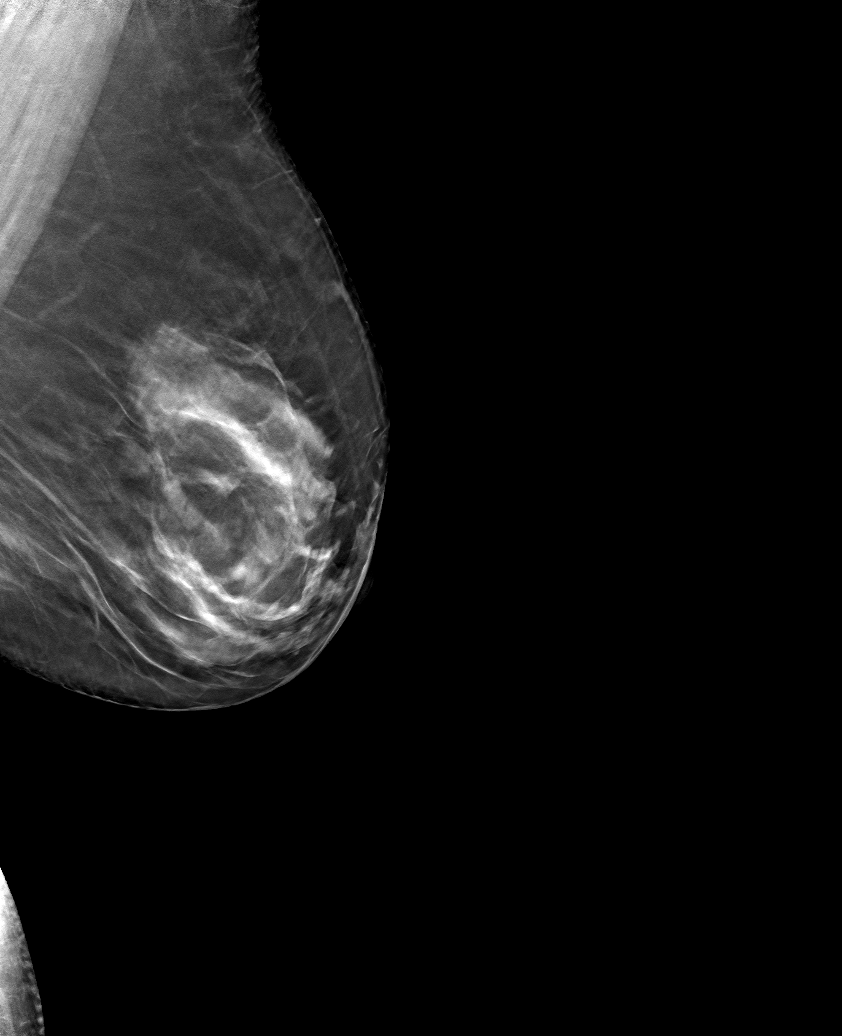

[L CC tomo (1 of 2) · tomo slice 31/61.0]
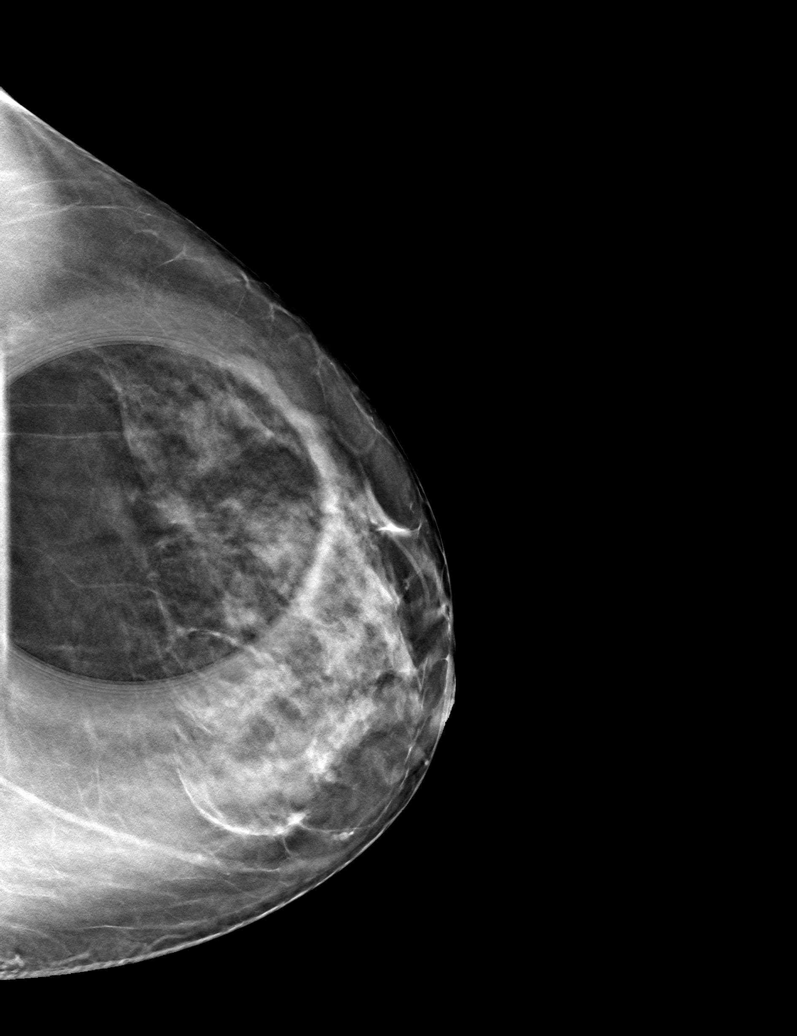

[L CC tomo (2 of 2) · tomo slice 29/57.0]
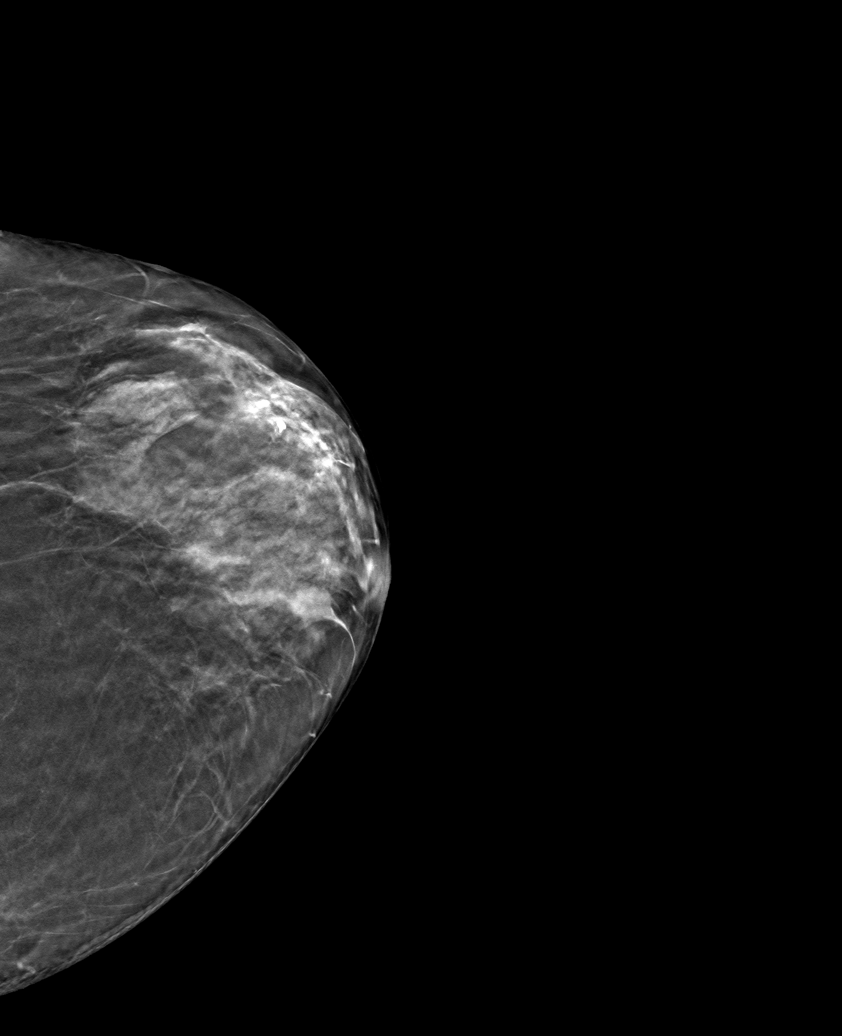

[6 of 18 positions shown; findings below may reference images not displayed]

ACR Breast Density Category c: The breast tissue is heterogeneously
dense, which may obscure small masses.
FINDINGS: Spot-compression CC view of the area of concern and full field
mediolateral and medially rolled CC views were obtained.

The asymmetry persists but partially disperses on the spot
compression view. There is no associated architectural distortion.
The asymmetry is not conspicuous on the full field mediolateral view
or on the medially rolled CC view, likely indicating overlapping
fibroglandular tissue.

Targeted ultrasound is performed in the outer breast, demonstrating
normal dense fibroglandular tissue. No cyst, solid mass or abnormal
acoustic shadowing is identified.
IMPRESSION: No mammographic or sonographic evidence of malignancy involving the
LEFT breast.

RECOMMENDATION:
Screening mammogram in one year.(Code:GA-E-02I)

I have discussed the findings and recommendations with the patient.
If applicable, a reminder letter will be sent to the patient
regarding the next appointment.

BI-RADS CATEGORY  1: Negative.

## 2022-08-21 IMAGING — US US BREAST*L* LIMITED INC AXILLA
1 series · 9 of 9 positions shown · non-contrast
Comparison: Previous exam(s).

CLINICAL DATA: Recall from screening mammography, possible
asymmetry in the slight outer breast at middle depth visible only on
the CC view.

EXAM:
DIGITAL DIAGNOSTIC UNILATERAL LEFT MAMMOGRAM WITH TOMOSYNTHESIS AND
CAD; ULTRASOUND LEFT BREAST LIMITED
TECHNIQUE: Left digital diagnostic mammography and breast tomosynthesis was
performed. The images were evaluated with computer-aided detection.;
Targeted ultrasound examination of the left breast was performed.

[Series 1: us breast*left* limited inc axilla · 0.07mm/px · 9 of 9 slices shown]
[im 1/9]
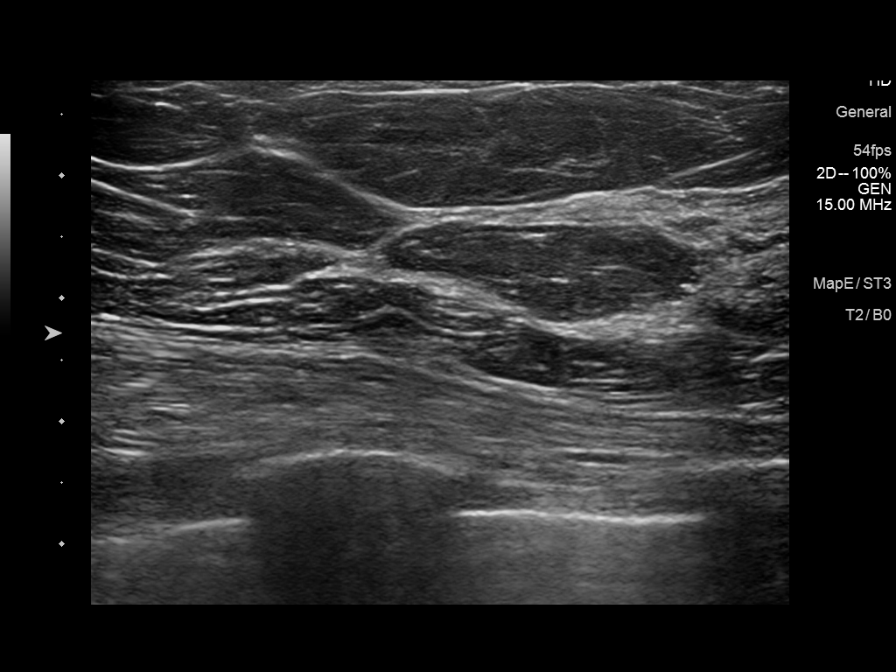
[im 2/9]
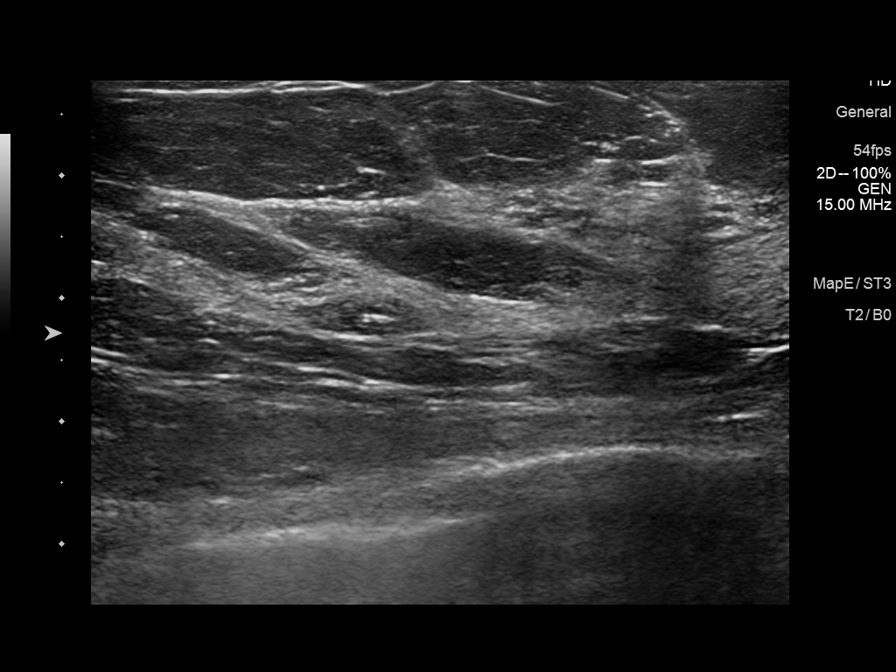
[im 3/9]
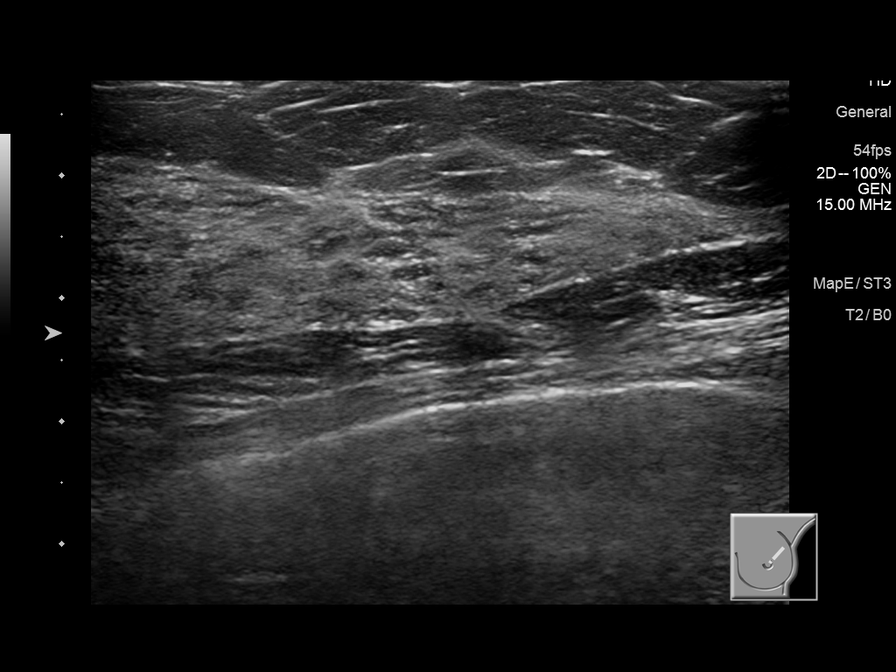
[im 4/9]
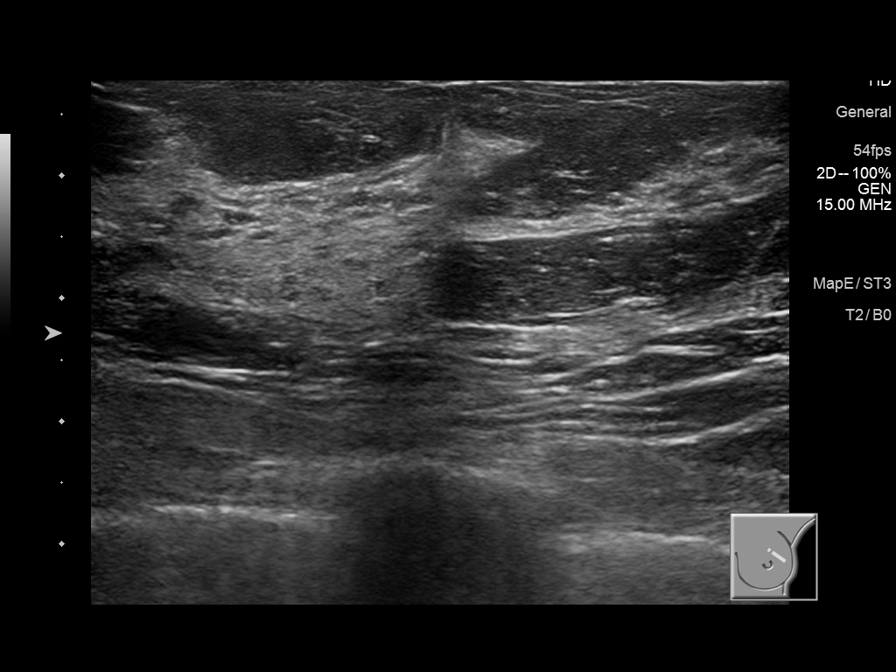
[im 5/9]
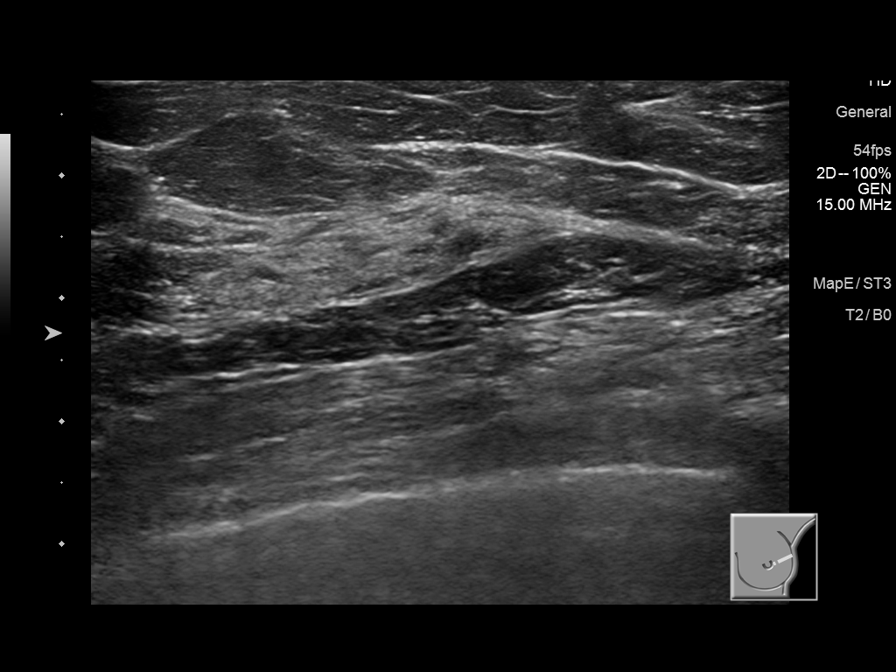
[im 6/9]
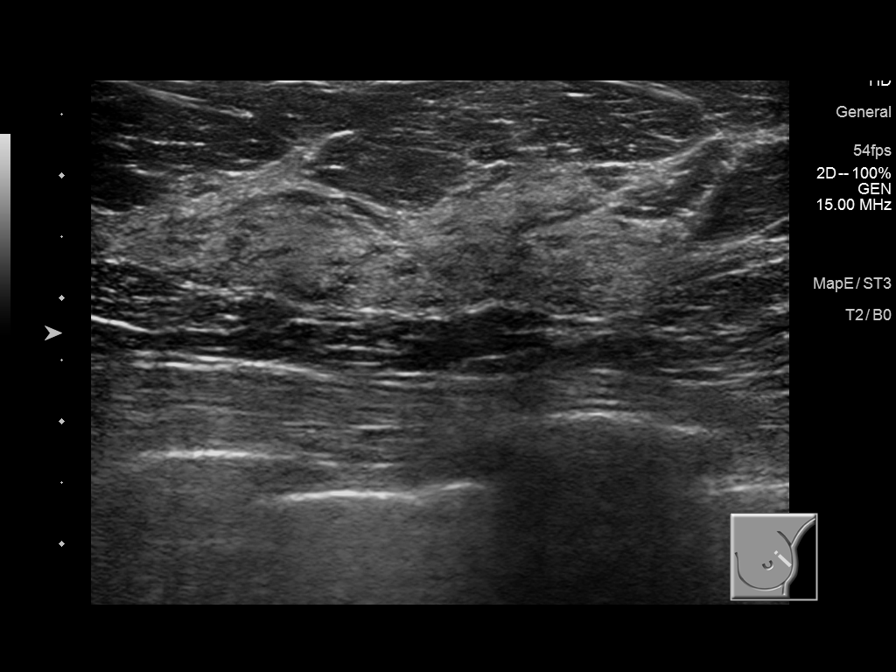
[im 7/9]
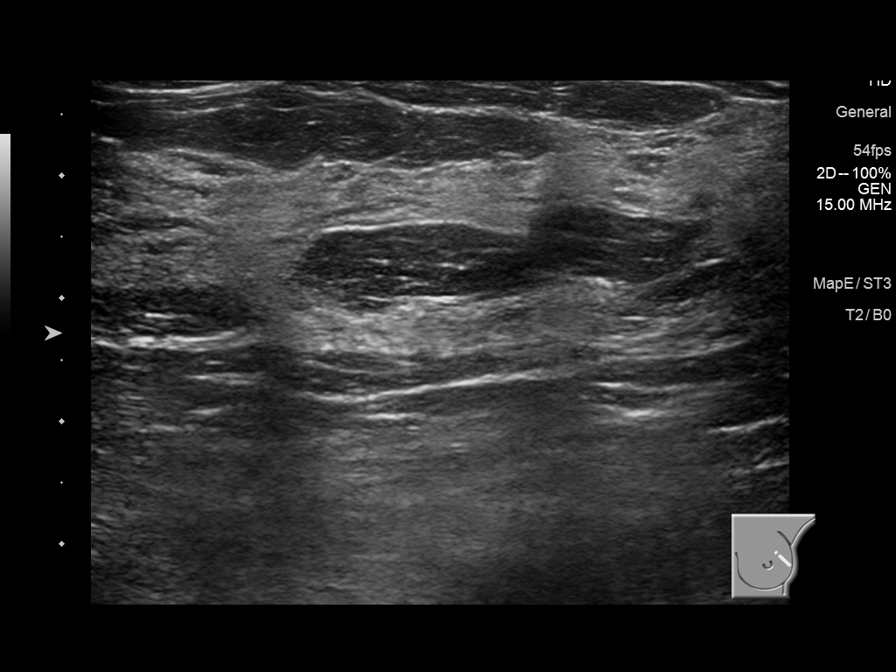
[im 8/9]
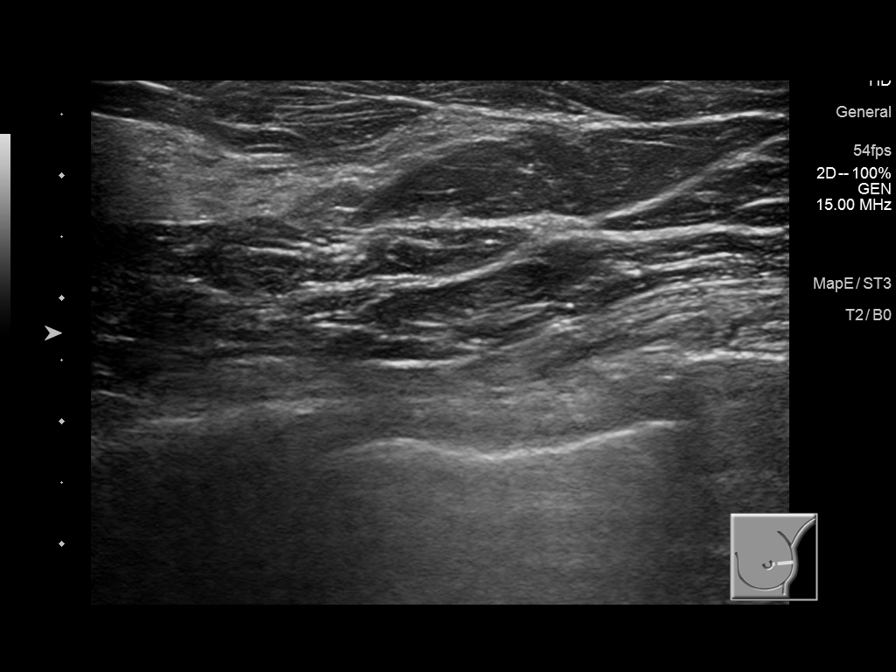
[im 9/9]
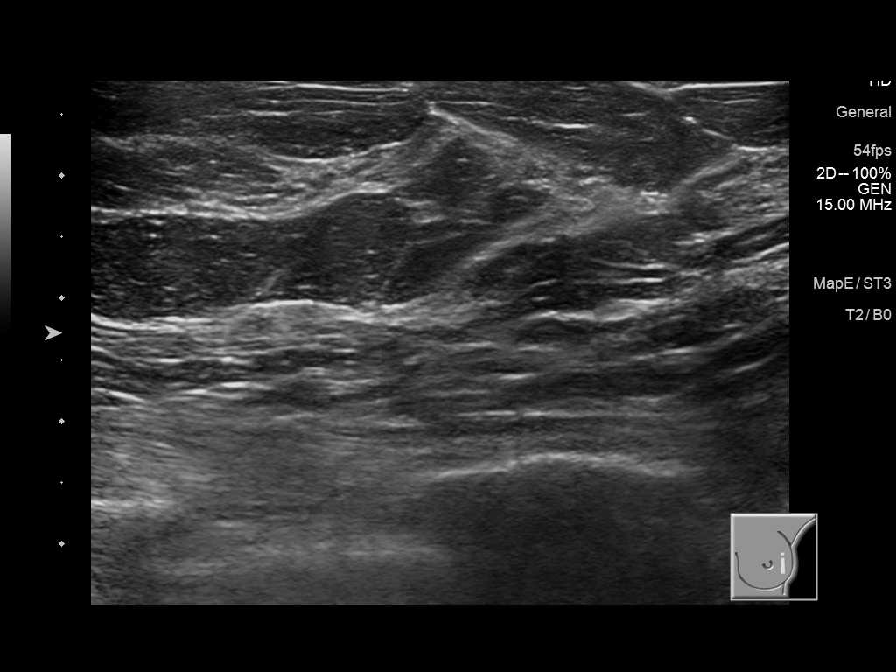

[9 of 9 positions shown; findings below may reference images not displayed]

ACR Breast Density Category c: The breast tissue is heterogeneously
dense, which may obscure small masses.
FINDINGS: Spot-compression CC view of the area of concern and full field
mediolateral and medially rolled CC views were obtained.

The asymmetry persists but partially disperses on the spot
compression view. There is no associated architectural distortion.
The asymmetry is not conspicuous on the full field mediolateral view
or on the medially rolled CC view, likely indicating overlapping
fibroglandular tissue.

Targeted ultrasound is performed in the outer breast, demonstrating
normal dense fibroglandular tissue. No cyst, solid mass or abnormal
acoustic shadowing is identified.
IMPRESSION: No mammographic or sonographic evidence of malignancy involving the
LEFT breast.

RECOMMENDATION:
Screening mammogram in one year.(Code:GA-E-02I)

I have discussed the findings and recommendations with the patient.
If applicable, a reminder letter will be sent to the patient
regarding the next appointment.

BI-RADS CATEGORY  1: Negative.

## 2022-08-25 DIAGNOSIS — M533 Sacrococcygeal disorders, not elsewhere classified: Secondary | ICD-10-CM | POA: Diagnosis not present

## 2022-08-25 DIAGNOSIS — M47816 Spondylosis without myelopathy or radiculopathy, lumbar region: Secondary | ICD-10-CM | POA: Diagnosis not present

## 2022-08-25 DIAGNOSIS — M545 Low back pain, unspecified: Secondary | ICD-10-CM | POA: Diagnosis not present

## 2022-08-25 DIAGNOSIS — M47896 Other spondylosis, lumbar region: Secondary | ICD-10-CM | POA: Diagnosis not present

## 2022-08-26 ENCOUNTER — Encounter: Payer: Self-pay | Admitting: Dermatology

## 2022-09-02 DIAGNOSIS — M533 Sacrococcygeal disorders, not elsewhere classified: Secondary | ICD-10-CM | POA: Diagnosis not present

## 2022-12-29 ENCOUNTER — Other Ambulatory Visit: Payer: Self-pay | Admitting: Family Medicine

## 2022-12-29 DIAGNOSIS — J4541 Moderate persistent asthma with (acute) exacerbation: Secondary | ICD-10-CM

## 2023-02-03 ENCOUNTER — Other Ambulatory Visit: Payer: Self-pay | Admitting: Family Medicine

## 2023-02-03 DIAGNOSIS — J4541 Moderate persistent asthma with (acute) exacerbation: Secondary | ICD-10-CM

## 2023-03-02 ENCOUNTER — Other Ambulatory Visit: Payer: Self-pay | Admitting: Family Medicine

## 2023-03-02 DIAGNOSIS — J4541 Moderate persistent asthma with (acute) exacerbation: Secondary | ICD-10-CM

## 2023-03-14 ENCOUNTER — Ambulatory Visit: Payer: BC Managed Care – PPO | Admitting: Family Medicine

## 2023-03-18 ENCOUNTER — Encounter: Payer: Self-pay | Admitting: Pulmonary Disease

## 2023-06-27 ENCOUNTER — Other Ambulatory Visit: Payer: Self-pay | Admitting: Family Medicine

## 2023-06-27 DIAGNOSIS — Z1231 Encounter for screening mammogram for malignant neoplasm of breast: Secondary | ICD-10-CM

## 2023-06-28 ENCOUNTER — Ambulatory Visit
Admission: RE | Admit: 2023-06-28 | Discharge: 2023-06-28 | Disposition: A | Discharge status: Home / Self Care | Payer: BC Managed Care – PPO | Source: Ambulatory Visit | Attending: Family Medicine | Admitting: Family Medicine

## 2023-06-28 DIAGNOSIS — Z1231 Encounter for screening mammogram for malignant neoplasm of breast: Secondary | ICD-10-CM | POA: Insufficient documentation

## 2023-06-30 NOTE — Progress Notes (Signed)
Hi Alyviah  Normal mammogram; repeat in 1 year.  Please let us know if you have any questions.  Thank you,  Merita Norton, FNP

## 2023-09-07 ENCOUNTER — Ambulatory Visit (INDEPENDENT_AMBULATORY_CARE_PROVIDER_SITE_OTHER): Payer: BC Managed Care – PPO

## 2023-09-07 DIAGNOSIS — Z23 Encounter for immunization: Secondary | ICD-10-CM | POA: Diagnosis not present

## 2023-10-11 ENCOUNTER — Ambulatory Visit (INDEPENDENT_AMBULATORY_CARE_PROVIDER_SITE_OTHER): Payer: BC Managed Care – PPO | Admitting: Pulmonary Disease

## 2023-10-11 ENCOUNTER — Other Ambulatory Visit
Admission: RE | Admit: 2023-10-11 | Discharge: 2023-10-11 | Disposition: A | Discharge status: Home / Self Care | Payer: BC Managed Care – PPO | Source: Ambulatory Visit | Attending: Pulmonary Disease | Admitting: Pulmonary Disease

## 2023-10-11 ENCOUNTER — Encounter: Payer: Self-pay | Admitting: Pulmonary Disease

## 2023-10-11 VITALS — BP 132/80 | HR 77 | Temp 97.8°F | Ht 66.0 in | Wt 165.8 lb

## 2023-10-11 DIAGNOSIS — R0602 Shortness of breath: Secondary | ICD-10-CM

## 2023-10-11 DIAGNOSIS — Z87891 Personal history of nicotine dependence: Secondary | ICD-10-CM | POA: Insufficient documentation

## 2023-10-11 DIAGNOSIS — J4530 Mild persistent asthma, uncomplicated: Secondary | ICD-10-CM

## 2023-10-11 DIAGNOSIS — Z7951 Long term (current) use of inhaled steroids: Secondary | ICD-10-CM | POA: Diagnosis not present

## 2023-10-11 DIAGNOSIS — Z825 Family history of asthma and other chronic lower respiratory diseases: Secondary | ICD-10-CM | POA: Diagnosis not present

## 2023-10-11 DIAGNOSIS — Z148 Genetic carrier of other disease: Secondary | ICD-10-CM | POA: Insufficient documentation

## 2023-10-11 DIAGNOSIS — J4489 Other specified chronic obstructive pulmonary disease: Secondary | ICD-10-CM | POA: Diagnosis not present

## 2023-10-11 LAB — NITRIC OXIDE: Nitric Oxide: 23

## 2023-10-11 MED ORDER — ALBUTEROL SULFATE HFA 108 (90 BASE) MCG/ACT IN AERS: INHALATION_SPRAY | RESPIRATORY_TRACT | 3 refills | Status: AC

## 2023-10-11 MED ORDER — BREZTRI AEROSPHERE 160-9-4.8 MCG/ACT IN AERO: 2.0000 | INHALATION_SPRAY | Freq: Two times a day (BID) | RESPIRATORY_TRACT | 11 refills | Status: DC

## 2023-10-11 MED ORDER — BREZTRI AEROSPHERE 160-9-4.8 MCG/ACT IN AERO: 2.0000 | INHALATION_SPRAY | Freq: Two times a day (BID) | RESPIRATORY_TRACT | 0 refills | Status: DC

## 2023-10-11 NOTE — Patient Instructions (Signed)
VISIT SUMMARY:  During today's visit, we discussed your asthma and COPD management. You reported a decrease in asthma symptoms and have been using your rescue inhaler less frequently. We also reviewed your medication regimen and family history of respiratory conditions.  YOUR PLAN:  -ASTHMA: Asthma is a condition where your airways narrow and swell, producing extra mucus, which can make breathing difficult. You have been using your rescue inhaler less frequently, which is a positive sign. However, you have been taking Symbicort only once daily due to cost and ran out of medication earlier in the week. We will switch you to [facility], which is similar to Symbicort but includes an additional ingredient to help manage your symptoms better. A coupon will be provided to assist with the cost. Additionally, your Albuterol inhaler will be refilled for use at work and home.  -COPD: Chronic Obstructive Pulmonary Disease (COPD) is a chronic inflammatory lung disease that obstructs airflow from the lungs. You have a minor element of COPD based on your breathing test results. We will continue to monitor this condition and have ordered a blood test to check for hereditary emphysema due to your family history.  -GENERAL HEALTH MAINTENANCE: You received a flu shot within the last two weeks. It is recommended that you get an RSV shot in December, at least three weeks after your flu shot, to protect against respiratory syncytial virus.  INSTRUCTIONS:  Please follow up in 4-6 months to assess your response to [facility]. Additionally, ensure you get the RSV shot in December, at least three weeks after your flu shot. A blood test has been ordered to check for hereditary emphysema due to your family history.

## 2023-10-11 NOTE — Progress Notes (Signed)
Subjective:    Patient ID: Terri Cruz, female    DOB: November 22, 1961, 62 y.o.   MRN: [id number]  Patient Care Team: Jacky Kindle, FNP as PCP - General [facility])  Chief Complaint  Patient presents with   Follow-up    Occasional DOE. No wheezing or cough.     BACKGROUND/INTERVAL: 62 year old former smoker, previously diagnosed with asthma initially seen by me in April 2023 with most recent follow-up by Rubye Oaks, NP in August 2023 presents for follow-up visit.  She has been maintained on Symbicort however, her insurance company no longer covers it.  Has noted increased shortness of breath since she ran out of her Symbicort few days ago.  HPI Discussed the use of AI scribe software for clinical note transcription with the patient, who gave verbal consent to proceed.  History of Present Illness   The patient, with a history of asthma and a minor element of COPD, reports a decrease in the severity of her asthma symptoms compared to the past. She has used her rescue inhaler approximately three times in the last month. She reports a noticeable need for rescue albuterol since running out of Symbicort.  The patient has previously tried Clinical cytogeneticist, a medication similar to Symbicort with additional glycopyrrolate. She has no new symptoms or complaints related to her respiratory conditions. She has not experienced any leg swelling or coughing up anything. She quit smoking many years ago.  The patient's mother had emphysema and was a smoker, and her sister has full-blown COPD despite never smoking herself. The patient has received a flu shot within the last two weeks.     TEST/EVENTS :  PFTs May 17, 2022 showed moderate airflow obstruction with significant reversibility FEV1 was 80%, ratio 67, FVC 92%, positive bronchodilator response (30% change), DLCO 79%. Chest x-ray December 28, 2021 showed no acute process. Allergy panel April 2023  negative, IgE 95, absolute eosinophil count 300    Review of Systems A 10 point review of systems was performed and it is as noted above otherwise negative.   Patient Active Problem List   Diagnosis Date Noted   Asthma 08/02/2022   Annual physical exam 03/25/2022   Need for tetanus, diphtheria, and acellular pertussis (Tdap) vaccine in patient of adolescent age or older 03/25/2022   Elevated glucose 03/25/2022   Encounter for lipid screening for cardiovascular disease 03/25/2022   Need for shingles vaccine 03/25/2022   Cervical cancer screening 03/25/2022   Encounter for screening mammogram for malignant neoplasm of breast 01/27/2022   Moderate persistent asthma with exacerbation 12/28/2021   Cough due to bronchospasm 12/28/2021   Impingement syndrome of shoulder region 05/04/2018   Cardiac murmur 01/30/2016   Allergic rhinitis, seasonal 01/30/2016   Leiomyoma of uterus 01/30/2016    Social History   Tobacco Use   Smoking status: Former    Current packs/day: 0.00    Average packs/day: 0.5 packs/day for 20.0 years (10.0 ttl pk-yrs)    Types: Cigarettes    Start date: 12/06/1977    Quit date: 12/06/1997    Years since quitting: 25.8   Smokeless tobacco: Never  Substance Use Topics   Alcohol use: Yes    Comment: occasionally    Allergies  Allergen Reactions   Codeine     headache   Hydrocodone Bit-Homatrop Mbr     Gi issues    Current Meds  Medication Sig   albuterol (VENTOLIN HFA) 108 (90 Base) MCG/ACT inhaler INHALE 2 PUFFS BY MOUTH EVERY  4 HOURS AS NEEDED FOR WHEEZING FOR SHORTNESS OF BREATH   Budeson-Glycopyrrol-Formoterol (BREZTRI AEROSPHERE) 160-9-4.8 MCG/ACT AERO Inhale 2 puffs into the lungs in the morning and at bedtime.   meloxicam (MOBIC) 15 MG tablet TAKE 1 TABLET BY MOUTH ONCE DAILY   Spacer/Aero-Holding Chambers (OPTICHAMBER DIAMOND) MISC Attach to inhaler   [DISCONTINUED] budesonide-formoterol (SYMBICORT) 160-4.5 MCG/ACT inhaler Inhale 2 puffs into the lungs in the morning and at bedtime.     Immunization History  Administered Date(s) Administered   Influenza Split 10/30/2007, 08/10/2012   Influenza, Seasonal, Injecte, Preservative Fre 09/07/2023   Influenza,inj,Quad PF,6+ Mos 10/14/2021   Influenza-Unspecified 09/05/2018   Pneumococcal Polysaccharide-23 09/26/2012   Tdap 03/25/2022   Zoster Recombinant(Shingrix) 03/25/2022, 06/04/2022        Objective:     BP 132/80 (BP Location: Right Arm, Cuff Size: Normal)   Pulse 77   Temp 97.8 F (36.6 C)   Ht 5\' 6"  (1.676 m)   Wt 165 lb 12.8 oz (75.2 kg)   LMP 02/11/2015   SpO2 96%   BMI 26.76 kg/m   SpO2: 96 % O2 Device: None (Room air)  GENERAL: Well-developed, well-nourished woman, no acute distress, fully ambulatory.  No conversational dyspnea. HEAD: Normocephalic, atraumatic.  EYES: Pupils equal, round, reactive to light.  No scleral icterus.  MOUTH: Dentition intact, oral mucosa moist. NECK: Supple. No thyromegaly. Trachea midline. No JVD.  No adenopathy. PULMONARY: Good air entry bilaterally.  No adventitious sounds. CARDIOVASCULAR: S1 and S2. Regular rate and rhythm.  No rubs, murmurs or gallops heard. ABDOMEN: Benign. MUSCULOSKELETAL: No joint deformity, no clubbing, no edema.  NEUROLOGIC: No overt focal deficit, no gait disturbance, speech is fluent. SKIN: Intact,warm,dry. PSYCH: Mood and behavior normal.  Lab Results  Component Value Date   NITRICOXIDE 23 10/11/2023     Assessment & Plan:     ICD-10-CM   1. 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 Shortness of breath  R06.02      Orders Placed This Encounter  Procedures   Alpha-1 antitrypsin phenotype    Standing Status:   Future    Standing Expiration Date:   10/10/2024   Nitric oxide   Meds ordered this encounter  Medications   Budeson-Glycopyrrol-Formoterol (BREZTRI AEROSPHERE) 160-9-4.8 MCG/ACT AERO    Sig: Inhale 2 puffs into the lungs in the morning and at bedtime.     Dispense:  10.7 g    Refill:  11   Discussion:    Asthma COPD Overlap Syndrome Patient has been taking Symbicort only once daily due to cost and ran out of medication earlier in the week. Noted minor element of COPD based on breathing test results. -Switch to [facility], which is similar to Symbicort but with an additional glycopyrrolate and provide a coupon for cost assistance. -Refill Albuterol inhaler for use as needed. -Order a blood test for hereditary emphysema due to family history (Alpha- 1-antitrypsin).  General Health Maintenance Received flu shot 1-2 weeks ago. -Advise to get RSV shot in December, at least 3 weeks after flu shot.  Follow-up in 4-6 months to assess response to [facility].      Gailen Shelter, MD Advanced Bronchoscopy PCCM  [facility]    *This note was generated using voice recognition software/Dragon and/or AI transcription program.  Despite best efforts to proofread, errors can occur which can change the meaning. Any transcriptional errors that result from this process are unintentional and may not be fully corrected  at the time of dictation.

## 2023-10-13 LAB — ALPHA-1-ANTITRYPSIN PHENOTYP: A-1 Antitrypsin, Ser: 109 mg/dL (ref 101–187)

## 2023-11-23 NOTE — Progress Notes (Signed)
Please see progress note of 11 October 2023, patient has COPD/asthma overlap difficult to control him strong family history.  This necessitates screening for other issues such as alpha 1 antitrypsin deficiency.  As it is the patient patient is noted to be a carrier of the disease which impacts the rest of her family.  Gailen Shelter, MD Advanced Bronchoscopy PCCM [facility]-

## 2024-03-15 DIAGNOSIS — M533 Sacrococcygeal disorders, not elsewhere classified: Secondary | ICD-10-CM | POA: Diagnosis not present

## 2024-04-16 DIAGNOSIS — M5416 Radiculopathy, lumbar region: Secondary | ICD-10-CM | POA: Diagnosis not present

## 2024-04-25 DIAGNOSIS — M5416 Radiculopathy, lumbar region: Secondary | ICD-10-CM | POA: Diagnosis not present

## 2024-05-11 DIAGNOSIS — M5416 Radiculopathy, lumbar region: Secondary | ICD-10-CM | POA: Diagnosis not present

## 2024-05-18 ENCOUNTER — Telehealth: Payer: Self-pay

## 2024-05-18 DIAGNOSIS — Z1239 Encounter for other screening for malignant neoplasm of breast: Secondary | ICD-10-CM

## 2024-05-18 NOTE — Telephone Encounter (Signed)
 That's fine, she can transfer to me. Her husband is a patient of mine.

## 2024-05-18 NOTE — Telephone Encounter (Signed)
 Copied from CRM [id number]. Topic: General - Other >> May 18, 2024  1:29 PM Sophia H wrote: Reason for CRM: Patient is calling in because she received a letter about scheduling a mammogram. When she reached out to the [facility] breast center she was told she needs her primary to put in new orders for her. The patient has not seen anyone new since she was seeing FNP Iona Manis, she is wanting to know if Dr. Shann Darnel would be willing to take her on as a patient as she's met with him in the past and felt it went well. Please advise of orders and if Dr. Shann Darnel will take her on. Ty Phone number # [phone]

## 2024-05-23 DIAGNOSIS — M5416 Radiculopathy, lumbar region: Secondary | ICD-10-CM | POA: Diagnosis not present

## 2024-05-23 DIAGNOSIS — M5386 Other specified dorsopathies, lumbar region: Secondary | ICD-10-CM | POA: Diagnosis not present

## 2024-06-04 DIAGNOSIS — M5416 Radiculopathy, lumbar region: Secondary | ICD-10-CM | POA: Diagnosis not present

## 2024-06-05 ENCOUNTER — Encounter: Payer: Self-pay | Admitting: Physician Assistant

## 2024-06-05 ENCOUNTER — Ambulatory Visit (INDEPENDENT_AMBULATORY_CARE_PROVIDER_SITE_OTHER): Admitting: Physician Assistant

## 2024-06-05 VITALS — BP 147/63 | HR 70 | Resp 16 | Ht 66.0 in | Wt 161.0 lb

## 2024-06-05 DIAGNOSIS — Z1239 Encounter for other screening for malignant neoplasm of breast: Secondary | ICD-10-CM

## 2024-06-05 DIAGNOSIS — Z1159 Encounter for screening for other viral diseases: Secondary | ICD-10-CM

## 2024-06-05 DIAGNOSIS — J302 Other seasonal allergic rhinitis: Secondary | ICD-10-CM | POA: Diagnosis not present

## 2024-06-05 DIAGNOSIS — Z13220 Encounter for screening for lipoid disorders: Secondary | ICD-10-CM

## 2024-06-05 DIAGNOSIS — R5383 Other fatigue: Secondary | ICD-10-CM | POA: Diagnosis not present

## 2024-06-05 DIAGNOSIS — M1990 Unspecified osteoarthritis, unspecified site: Secondary | ICD-10-CM

## 2024-06-05 DIAGNOSIS — J4541 Moderate persistent asthma with (acute) exacerbation: Secondary | ICD-10-CM | POA: Diagnosis not present

## 2024-06-05 DIAGNOSIS — R7309 Other abnormal glucose: Secondary | ICD-10-CM

## 2024-06-05 DIAGNOSIS — Z114 Encounter for screening for human immunodeficiency virus [HIV]: Secondary | ICD-10-CM

## 2024-06-05 DIAGNOSIS — R0683 Snoring: Secondary | ICD-10-CM

## 2024-06-05 NOTE — Progress Notes (Unsigned)
 Established patient visit  Patient: Terri Cruz   DOB: 05-02-1961   63 y.o. Female  MRN: [id number] Visit Date: 06/05/2024  Today's healthcare provider: Jolynn Spencer, PA-C   Chief Complaint  Patient presents with   Transitions Of Care    TOC/ Mammo referral/ Labs   Subjective     HPI     Transitions Of Care    Additional comments: TOC/ Mammo referral/ Labs      Last edited by Marylen Odella CROME, CMA on 06/05/2024  1:24 PM.       Discussed the use of AI scribe software for clinical note transcription with the patient, who gave verbal consent to proceed.  History of Present Illness        06/05/2024    1:34 PM 03/25/2022    3:41 PM 01/27/2022    4:07 PM  Depression screen PHQ 2/9  Decreased Interest 0 0 1  Down, Depressed, Hopeless 0 1 0  PHQ - 2 Score 0 1 1  Altered sleeping 1 2 1   Tired, decreased energy 0 0 1  Change in appetite 0 0 0  Feeling bad or failure about yourself  0 0 0  Trouble concentrating 0 0 1  Moving slowly or fidgety/restless 0 0 1  Suicidal thoughts 0 0 0  PHQ-9 Score 1 3 5   Difficult doing work/chores Not difficult at all Not difficult at all Somewhat difficult      06/05/2024    1:35 PM  GAD 7 : Generalized Anxiety Score  Nervous, Anxious, on Edge 0  Control/stop worrying 0  Worry too much - different things 0  Trouble relaxing 0  Restless 0  Easily annoyed or irritable 0  Afraid - awful might happen 0  Total GAD 7 Score 0  Anxiety Difficulty Not difficult at all    Medications: Outpatient Medications Prior to Visit  Medication Sig   albuterol  (VENTOLIN  HFA) 108 (90 Base) MCG/ACT inhaler INHALE 2 PUFFS BY MOUTH EVERY 4 HOURS AS NEEDED FOR WHEEZING FOR SHORTNESS OF BREATH   Budeson-Glycopyrrol-Formoterol  (BREZTRI  AEROSPHERE) 160-9-4.8 MCG/ACT AERO Inhale 2 puffs into the lungs in the morning and at bedtime.   Budeson-Glycopyrrol-Formoterol  (BREZTRI  AEROSPHERE) 160-9-4.8 MCG/ACT AERO Inhale 2 puffs into the lungs in the morning  and at bedtime.   meloxicam  (MOBIC ) 15 MG tablet TAKE 1 TABLET BY MOUTH ONCE DAILY   Spacer/Aero-Holding Chambers (OPTICHAMBER DIAMOND ) MISC Attach to inhaler   cetirizine  (ZYRTEC ) 10 MG tablet Take 1 tablet by mouth once daily (Patient not taking: Reported on 06/05/2024)   montelukast  (SINGULAIR ) 10 MG tablet Take 1 tablet (10 mg total) by mouth at bedtime. (Patient not taking: Reported on 06/05/2024)   No facility-administered medications prior to visit.    Review of Systems All negative Except see HPI   {Insert previous labs (optional):23779} {See past labs  Heme  Chem  Endocrine  Serology  Results Review (optional):1}   Objective    BP (!) 149/63 (BP Location: Left Arm, Patient Position: Sitting)   Pulse 74   Resp 16   Ht 5' 6 (1.676 m)   Wt 161 lb (73 kg)   LMP 02/11/2015   SpO2 98%   BMI 25.99 kg/m  {Insert last BP/Wt (optional):23777}{See vitals history (optional):1}   Physical Exam   No results found for any visits on 06/05/24.      Assessment and Plan Assessment & Plan     Orders Placed This Encounter  Procedures   MM 3D SCREENING MAMMOGRAM BILATERAL  BREAST    Standing Status:   Future    Expiration Date:   06/05/2025    Reason for Exam (SYMPTOM  OR DIAGNOSIS REQUIRED):   screening for breast cancer    Preferred imaging location?:   [facility]    No follow-ups on file.   The patient was advised to call back or seek an in-person evaluation if the symptoms worsen or if the condition fails to improve as anticipated.  I discussed the assessment and treatment plan with the patient. The patient was provided an opportunity to ask questions and all were answered. The patient agreed with the plan and demonstrated an understanding of the instructions.  I, Alaja Goldinger, PA-C have reviewed all documentation for this visit. The documentation on 06/05/2024  for the exam, diagnosis, procedures, and orders are all accurate and complete.  Jolynn Spencer, [facility],  MMS [facility] [phone] (phone) [phone] (fax)  [facility]

## 2024-06-06 DIAGNOSIS — M1990 Unspecified osteoarthritis, unspecified site: Secondary | ICD-10-CM | POA: Insufficient documentation

## 2024-06-06 DIAGNOSIS — Z1239 Encounter for other screening for malignant neoplasm of breast: Secondary | ICD-10-CM | POA: Diagnosis not present

## 2024-06-06 DIAGNOSIS — R0683 Snoring: Secondary | ICD-10-CM | POA: Insufficient documentation

## 2024-06-07 LAB — COMPREHENSIVE METABOLIC PANEL WITH GFR
ALT: 19 IU/L (ref 0–32)
AST: 25 IU/L (ref 0–40)
Albumin: 4.8 g/dL (ref 3.9–4.9)
Alkaline Phosphatase: 70 IU/L (ref 44–121)
BUN/Creatinine Ratio: 26 (ref 12–28)
BUN: 17 mg/dL (ref 8–27)
Bilirubin Total: 0.9 mg/dL (ref 0.0–1.2)
CO2: 22 mmol/L (ref 20–29)
Calcium: 9.7 mg/dL (ref 8.7–10.3)
Chloride: 99 mmol/L (ref 96–106)
Creatinine, Ser: 0.65 mg/dL (ref 0.57–1.00)
Globulin, Total: 2.6 g/dL (ref 1.5–4.5)
Glucose: 84 mg/dL (ref 70–99)
Potassium: 4.5 mmol/L (ref 3.5–5.2)
Sodium: 139 mmol/L (ref 134–144)
Total Protein: 7.4 g/dL (ref 6.0–8.5)
eGFR: 98 mL/min/{1.73_m2} (ref >59)

## 2024-06-07 LAB — HEPATITIS C ANTIBODY: Hep C Virus Ab: NONREACTIVE

## 2024-06-07 LAB — CBC WITH DIFFERENTIAL/PLATELET
Basophils Absolute: 0.1 10*3/uL (ref 0.0–0.2)
Basos: 1 %
EOS (ABSOLUTE): 0 10*3/uL (ref 0.0–0.4)
Eos: 0 %
Hematocrit: 34.4 % (ref 34.0–46.6)
Hemoglobin: 11.3 g/dL (ref 11.1–15.9)
Immature Grans (Abs): 0 10*3/uL (ref 0.0–0.1)
Immature Granulocytes: 0 %
Lymphocytes Absolute: 3.4 10*3/uL — ABNORMAL HIGH (ref 0.7–3.1)
Lymphs: 34 %
MCH: 30.5 pg (ref 26.6–33.0)
MCHC: 32.8 g/dL (ref 31.5–35.7)
MCV: 93 fL (ref 79–97)
Monocytes Absolute: 0.8 10*3/uL (ref 0.1–0.9)
Monocytes: 8 %
Neutrophils Absolute: 5.9 10*3/uL (ref 1.4–7.0)
Neutrophils: 57 %
Platelets: 383 10*3/uL (ref 150–450)
RBC: 3.71 x10E6/uL — ABNORMAL LOW (ref 3.77–5.28)
RDW: 14.1 % (ref 11.7–15.4)
WBC: 10.3 10*3/uL (ref 3.4–10.8)

## 2024-06-07 LAB — LIPID PANEL
Chol/HDL Ratio: 2.4 ratio (ref 0.0–4.4)
Cholesterol, Total: 247 mg/dL — ABNORMAL HIGH (ref 100–199)
HDL: 102 mg/dL (ref >39)
LDL Chol Calc (NIH): 137 mg/dL — ABNORMAL HIGH (ref 0–99)
Triglycerides: 48 mg/dL (ref 0–149)
VLDL Cholesterol Cal: 8 mg/dL (ref 5–40)

## 2024-06-07 LAB — HIV ANTIBODY (ROUTINE TESTING W REFLEX): HIV Screen 4th Generation wRfx: NONREACTIVE

## 2024-06-07 LAB — HEMOGLOBIN A1C
Est. average glucose Bld gHb Est-mCnc: 114 mg/dL
Hgb A1c MFr Bld: 5.6 % (ref 4.8–5.6)

## 2024-06-07 LAB — TSH: TSH: 1.21 u[IU]/mL (ref 0.450–4.500)

## 2024-06-12 ENCOUNTER — Ambulatory Visit: Payer: Self-pay | Admitting: Physician Assistant

## 2024-06-19 NOTE — Progress Notes (Signed)
 Established patient visit  Patient: Terri Cruz   DOB: [birth year]   63 y.o. Female  MRN: [id number] Visit Date: 06/20/2024  Today's healthcare provider: Jolynn Spencer, PA-C   No chief complaint on file.  Subjective       Discussed the use of AI scribe software for clinical note transcription with the patient, who gave verbal consent to proceed.  History of Present Illness  t elevated cholesterol level. Advise low-cholesterol diet and exercise       06/05/2024    1:34 PM 03/25/2022    3:41 PM 01/27/2022    4:07 PM  Depression screen PHQ 2/9  Decreased Interest 0 0 1  Down, Depressed, Hopeless 0 1 0  PHQ - 2 Score 0 1 1  Altered sleeping 1 2 1   Tired, decreased energy 0 0 1  Change in appetite 0 0 0  Feeling bad or failure about yourself  0 0 0  Trouble concentrating 0 0 1  Moving slowly or fidgety/restless 0 0 1  Suicidal thoughts 0 0 0  PHQ-9 Score 1 3 5   Difficult doing work/chores Not difficult at all Not difficult at all Somewhat difficult      06/05/2024    1:35 PM  GAD 7 : Generalized Anxiety Score  Nervous, Anxious, on Edge 0  Control/stop worrying 0  Worry too much - different things 0  Trouble relaxing 0  Restless 0  Easily annoyed or irritable 0  Afraid - awful might happen 0  Total GAD 7 Score 0  Anxiety Difficulty Not difficult at all    Medications: Outpatient Medications Prior to Visit  Medication Sig   albuterol  (VENTOLIN  HFA) 108 (90 Base) MCG/ACT inhaler INHALE 2 PUFFS BY MOUTH EVERY 4 HOURS AS NEEDED FOR WHEEZING FOR SHORTNESS OF BREATH   Budeson-Glycopyrrol-Formoterol  (BREZTRI  AEROSPHERE) 160-9-4.8 MCG/ACT AERO Inhale 2 puffs into the lungs in the morning and at bedtime.   Budeson-Glycopyrrol-Formoterol  (BREZTRI  AEROSPHERE) 160-9-4.8 MCG/ACT AERO Inhale 2 puffs into the lungs in the morning and at bedtime.   cetirizine  (ZYRTEC ) 10 MG tablet Take 1 tablet by mouth once daily (Patient not taking: Reported on 06/05/2024)   meloxicam  (MOBIC ) 15  MG tablet TAKE 1 TABLET BY MOUTH ONCE DAILY   montelukast  (SINGULAIR ) 10 MG tablet Take 1 tablet (10 mg total) by mouth at bedtime. (Patient not taking: Reported on 06/05/2024)   Spacer/Aero-Holding Chambers (OPTICHAMBER DIAMOND ) MISC Attach to inhaler   No facility-administered medications prior to visit.    Review of Systems  All other systems reviewed and are negative.  All negative Except see HPI   {Insert previous labs (optional):23779} {See past labs  Heme  Chem  Endocrine  Serology  Results Review (optional):1}   Objective    LMP 02/11/2015  {Insert last BP/Wt (optional):23777}{See vitals history (optional):1}   Physical Exam Vitals reviewed.  Constitutional:      General: She is not in acute distress.    Appearance: Normal appearance. She is well-developed. She is not diaphoretic.  HENT:     Head: Normocephalic and atraumatic.  Eyes:     General: No scleral icterus.    Conjunctiva/sclera: Conjunctivae normal.  Neck:     Thyroid: No thyromegaly.  Cardiovascular:     Rate and Rhythm: Normal rate and regular rhythm.     Pulses: Normal pulses.     Heart sounds: Normal heart sounds. No murmur heard. Pulmonary:     Effort: Pulmonary effort is normal. No respiratory distress.     Breath  sounds: Normal breath sounds. No wheezing, rhonchi or rales.  Musculoskeletal:     Cervical back: Neck supple.     Right lower leg: No edema.     Left lower leg: No edema.  Lymphadenopathy:     Cervical: No cervical adenopathy.  Skin:    General: Skin is warm and dry.     Findings: No rash.  Neurological:     Mental Status: She is alert and oriented to person, place, and time. Mental status is at baseline.  Psychiatric:        Mood and Affect: Mood normal.        Behavior: Behavior normal.      No results found for any visits on 06/20/24.      Assessment and Plan Assessment & Plan   t elevated cholesterol level. Advise low-cholesterol diet and exercise   No orders  of the defined types were placed in this encounter.   No follow-ups on file.   The patient was advised to call back or seek an in-person evaluation if the symptoms worsen or if the condition fails to improve as anticipated.  I discussed the assessment and treatment plan with the patient. The patient was provided an opportunity to ask questions and all were answered. The patient agreed with the plan and demonstrated an understanding of the instructions.  I, Jontay Maston, PA-C have reviewed all documentation for this visit. The documentation on 06/20/2024  for the exam, diagnosis, procedures, and orders are all accurate and complete.  Jolynn Spencer, [facility], MMS [facility] [phone] (phone) [phone] (fax)  [facility]

## 2024-06-20 ENCOUNTER — Ambulatory Visit (INDEPENDENT_AMBULATORY_CARE_PROVIDER_SITE_OTHER): Admitting: Physician Assistant

## 2024-06-20 ENCOUNTER — Encounter: Payer: Self-pay | Admitting: Physician Assistant

## 2024-06-20 VITALS — BP 135/76 | HR 75 | Resp 16

## 2024-06-20 DIAGNOSIS — E7800 Pure hypercholesterolemia, unspecified: Secondary | ICD-10-CM | POA: Insufficient documentation

## 2024-06-20 DIAGNOSIS — J4530 Mild persistent asthma, uncomplicated: Secondary | ICD-10-CM

## 2024-06-20 DIAGNOSIS — Z1211 Encounter for screening for malignant neoplasm of colon: Secondary | ICD-10-CM | POA: Diagnosis not present

## 2024-06-20 DIAGNOSIS — I10 Essential (primary) hypertension: Secondary | ICD-10-CM | POA: Insufficient documentation

## 2024-06-20 DIAGNOSIS — M5440 Lumbago with sciatica, unspecified side: Secondary | ICD-10-CM | POA: Diagnosis not present

## 2024-06-20 DIAGNOSIS — R0683 Snoring: Secondary | ICD-10-CM | POA: Diagnosis not present

## 2024-06-20 DIAGNOSIS — M1990 Unspecified osteoarthritis, unspecified site: Secondary | ICD-10-CM

## 2024-06-20 DIAGNOSIS — J4540 Moderate persistent asthma, uncomplicated: Secondary | ICD-10-CM

## 2024-06-20 DIAGNOSIS — J9801 Acute bronchospasm: Secondary | ICD-10-CM

## 2024-06-20 MED ORDER — LOSARTAN POTASSIUM 25 MG PO TABS: 25.0000 mg | ORAL_TABLET | Freq: Every day | ORAL | 1 refills | Status: DC

## 2024-06-29 ENCOUNTER — Ambulatory Visit
Admission: RE | Admit: 2024-06-29 | Discharge: 2024-06-29 | Disposition: A | Discharge status: Home / Self Care | Source: Ambulatory Visit | Attending: Physician Assistant | Admitting: Physician Assistant

## 2024-06-29 DIAGNOSIS — Z1231 Encounter for screening mammogram for malignant neoplasm of breast: Secondary | ICD-10-CM | POA: Diagnosis not present

## 2024-06-29 DIAGNOSIS — Z1239 Encounter for other screening for malignant neoplasm of breast: Secondary | ICD-10-CM

## 2024-07-04 ENCOUNTER — Other Ambulatory Visit: Payer: Self-pay | Admitting: Physician Assistant

## 2024-07-04 DIAGNOSIS — R928 Other abnormal and inconclusive findings on diagnostic imaging of breast: Secondary | ICD-10-CM

## 2024-07-06 ENCOUNTER — Ambulatory Visit
Admission: RE | Admit: 2024-07-06 | Discharge: 2024-07-06 | Disposition: A | Discharge status: Home / Self Care | Source: Ambulatory Visit | Attending: Physician Assistant | Admitting: Physician Assistant

## 2024-07-06 DIAGNOSIS — R92333 Mammographic heterogeneous density, bilateral breasts: Secondary | ICD-10-CM | POA: Diagnosis not present

## 2024-07-06 DIAGNOSIS — Z1211 Encounter for screening for malignant neoplasm of colon: Secondary | ICD-10-CM | POA: Diagnosis not present

## 2024-07-06 DIAGNOSIS — R928 Other abnormal and inconclusive findings on diagnostic imaging of breast: Secondary | ICD-10-CM | POA: Diagnosis not present

## 2024-07-10 ENCOUNTER — Ambulatory Visit: Payer: Self-pay | Admitting: Physician Assistant

## 2024-07-12 LAB — COLOGUARD: COLOGUARD: POSITIVE — AB

## 2024-07-13 ENCOUNTER — Ambulatory Visit: Payer: Self-pay | Admitting: Physician Assistant

## 2024-07-13 DIAGNOSIS — Z1211 Encounter for screening for malignant neoplasm of colon: Secondary | ICD-10-CM

## 2024-07-13 NOTE — Progress Notes (Signed)
 If  patient agrees, please place a referral to GI for colonoscopy

## 2024-07-27 DIAGNOSIS — R195 Other fecal abnormalities: Secondary | ICD-10-CM | POA: Diagnosis not present

## 2024-08-01 ENCOUNTER — Encounter: Payer: Self-pay | Admitting: Physician Assistant

## 2024-08-01 ENCOUNTER — Ambulatory Visit (INDEPENDENT_AMBULATORY_CARE_PROVIDER_SITE_OTHER): Admitting: Physician Assistant

## 2024-08-01 VITALS — BP 149/62 | HR 57 | Temp 98.4°F | Ht 66.0 in | Wt 158.7 lb

## 2024-08-01 DIAGNOSIS — I10 Essential (primary) hypertension: Secondary | ICD-10-CM | POA: Diagnosis not present

## 2024-08-01 DIAGNOSIS — Z0000 Encounter for general adult medical examination without abnormal findings: Secondary | ICD-10-CM | POA: Diagnosis not present

## 2024-08-01 NOTE — Progress Notes (Signed)
 "    Complete physical exam  Patient: Terri Cruz   DOB: 04-Apr-1961   63 y.o. Female  MRN: [id number] Visit Date: 08/01/2024  Today's healthcare provider: Jolynn Spencer, PA-C   Chief Complaint  Patient presents with   Annual Exam    Diet: Normal, avoids excess salt and fat Exercise: Walking daily  Sleep: Well Overall feeling: Well Screenings: Will have colonoscopy done on 9/8 Vaccines: will get prevnar 20 today    Subjective    Terri Cruz is a 63 y.o. female who presents today for a complete physical exam.   Discussed the use of AI scribe software for clinical note transcription with the patient, who gave verbal consent to proceed.  History of Present Illness Terri Cruz is a 63 year old female with hypertension who presents with concerns about blood pressure management.  Blood pressure readings vary from 117/70 to 165/70, with higher readings on workdays. She takes Losartan  daily at 3 AM without significant side effects. She avoids salty foods and stays hydrated, especially at work. Occasional nocturnal palpitations disrupt her sleep. Her job is very stressful, which she suspects may affect her blood pressure.  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 Post-removal, she is regaining strength to resume aerobics at home. She has not exercised recently due to back issues.    Last depression screening scores    08/01/2024    3:11 PM 06/05/2024    1:34 PM 03/25/2022    3:41 PM  PHQ 2/9 Scores  PHQ - 2 Score 0 0 1  PHQ- 9 Score 0 1 3   Last fall risk screening    08/01/2024    3:11 PM  Fall Risk   Falls in the past year? 0  Number falls in past yr: 0  Injury with Fall? 0  Risk for fall due to : No Fall Risks  Follow up Falls evaluation completed   Last Audit-C alcohol use screening    07/31/2024    4:35 PM  Alcohol Use Disorder Test (AUDIT)  1. How often do you have a drink containing alcohol? 3  2. How many drinks containing  alcohol do you have on a typical day when you are drinking? 0  3. How often do you have six or more drinks on one occasion? 1  AUDIT-C Score 4   4. How often during the last year have you found that you were not able to stop drinking once you had started? 0  5. How often during the last year have you failed to do what was normally expected from you because of drinking? 0  6. How often during the last year have you needed a first drink in the morning to get yourself going after a heavy drinking session? 0  7. How often during the last year have you had a feeling of guilt of remorse after drinking? 0  8. How often during the last year have you been unable to remember what happened the night before because you had been drinking? 0  9. Have you or someone else been injured as a result of your drinking? 0  10. Has a relative or friend or a doctor or another health worker been concerned about your drinking or suggested you cut down? 0  Alcohol Use Disorder Identification Test Final Score (AUDIT) 4      Patient-reported   A score of 3 or more in women, and 4 or more in men indicates  increased risk for alcohol abuse, EXCEPT if all of the points are from question 1   Past Medical History:  Diagnosis Date   Allergy 2013   Arthritis    Asthma 2013   Cataract    Heart murmur as a young adult   Hypertension 2025   Past Surgical History:  Procedure Laterality Date   TUBAL LIGATION     Social History   Socioeconomic History   Marital status: Married    Spouse name: Not on file   Number of children: Not on file   Years of education: Not on file   Highest education level: Associate degree: occupational, scientist, product/process development, or vocational program  Occupational History   Not on file  Tobacco Use   Smoking status: Former    Current packs/day: 0.00    Average packs/day: 0.5 packs/day for 20.0 years (10.0 ttl pk-yrs)    Types: Cigarettes    Start date: 12/06/1977    Quit date: 12/06/1997    Years since  quitting: 26.6   Smokeless tobacco: Never  Substance and Sexual Activity   Alcohol use: Yes    Alcohol/week: 3.0 - 6.0 standard drinks of alcohol    Types: 3 - 6 Cans of beer per week    Comment: occasionally   Drug use: No   Sexual activity: Not Currently  Other Topics Concern   Not on file  Social History Narrative   Not on file   Social Drivers of Health   Financial Resource Strain: Low Risk  (07/31/2024)   Overall Financial Resource Strain (CARDIA)    Difficulty of Paying Living Expenses: Not very hard  Food Insecurity: No Food Insecurity (07/31/2024)   Hunger Vital Sign    Worried About Running Out of Food in the Last Year: Never true    Ran Out of Food in the Last Year: Never true  Transportation Needs: No Transportation Needs (07/31/2024)   PRAPARE - Administrator, Civil Service (Medical): No    Lack of Transportation (Non-Medical): No  Physical Activity: Sufficiently Active (07/31/2024)   Exercise Vital Sign    Days of Exercise per Week: 5 days    Minutes of Exercise per Session: 30 min  Stress: Stress Concern Present (07/31/2024)   [employer] of Occupational Health - Occupational Stress Questionnaire    Feeling of Stress: To some extent  Social Connections: Moderately Integrated (07/31/2024)   Social Connection and Isolation Panel    Frequency of Communication with Friends and Family: Twice a week    Frequency of Social Gatherings with Friends and Family: Once a week    Attends Religious Services: 1 to 4 times per year    Active Member of [employer] or Organizations: No    Attends Engineer, Structural: Not on file    Marital Status: Married  Catering Manager Violence: Not on file   Family Status  Relation Name Status   Mother Ronal Arlean Delores Hardie Deceased at age 51   Father Gordan Hardie Deceased at age 23   Sister  Alive   Brother  Alive   Oceanographer  (Not Specified)  No partnership data on file   Family History  Problem Relation Age  of Onset   Alcohol abuse Mother    Emphysema Mother    Leukemia Mother    Liver cancer Mother    Arthritis Mother    Hypertension Father    Stroke Father    Hearing loss Father    Obesity Father  Breast cancer Sister    Yvone' disease Sister    Breast cancer Paternal Aunt    Allergies  Allergen Reactions   Codeine     headache   Hydrocodone Other (See Comments)    Gi issues   Hydrocodone Bit-Homatrop Mbr     Gi issues    Patient Care Team: Randell Detter, PA-C as PCP - General (Physician Assistant)   Medications: Outpatient Medications Prior to Visit  Medication Sig   albuterol  (VENTOLIN  HFA) 108 (90 Base) MCG/ACT inhaler INHALE 2 PUFFS BY MOUTH EVERY 4 HOURS AS NEEDED FOR WHEEZING FOR SHORTNESS OF BREATH (Patient taking differently: every 4 (four) hours as needed. INHALE 2 PUFFS BY MOUTH EVERY 4 HOURS AS NEEDED FOR WHEEZING FOR SHORTNESS OF BREATH)   Budeson-Glycopyrrol-Formoterol  (BREZTRI  AEROSPHERE) 160-9-4.8 MCG/ACT AERO Inhale 2 puffs into the lungs in the morning and at bedtime.   Budeson-Glycopyrrol-Formoterol  (BREZTRI  AEROSPHERE) 160-9-4.8 MCG/ACT AERO Inhale 2 puffs into the lungs in the morning and at bedtime.   losartan  (COZAAR ) 25 MG tablet Take 1 tablet (25 mg total) by mouth daily.   meloxicam  (MOBIC ) 15 MG tablet TAKE 1 TABLET BY MOUTH ONCE DAILY   Spacer/Aero-Holding Chambers (OPTICHAMBER DIAMOND ) MISC Attach to inhaler (Patient taking differently: as needed. Attach to inhaler)   [DISCONTINUED] cetirizine  (ZYRTEC ) 10 MG tablet Take 1 tablet by mouth once daily (Patient not taking: Reported on 06/05/2024)   [DISCONTINUED] montelukast  (SINGULAIR ) 10 MG tablet Take 1 tablet (10 mg total) by mouth at bedtime. (Patient not taking: Reported on 06/05/2024)   No facility-administered medications prior to visit.    Review of Systems  All other systems reviewed and are negative.  Except see HPI     Objective    BP (!) 163/70 (BP Location: Left Arm, Patient  Position: Sitting, Cuff Size: Normal)   Pulse (!) 57   Temp 98.4 F (36.9 C) (Oral)   Ht 5' 6 (1.676 m)   Wt 158 lb 11.2 oz (72 kg)   LMP 02/11/2015   SpO2 100%   BMI 25.61 kg/m      Physical Exam Vitals reviewed.  Constitutional:      General: She is not in acute distress.    Appearance: Normal appearance. She is well-developed. She is not ill-appearing, toxic-appearing or diaphoretic.  HENT:     Head: Normocephalic and atraumatic.     Right Ear: Tympanic membrane, ear canal and external ear normal.     Left Ear: Tympanic membrane, ear canal and external ear normal.     Nose: Nose normal. No congestion or rhinorrhea.     Mouth/Throat:     Mouth: Mucous membranes are moist.     Pharynx: Oropharynx is clear. No oropharyngeal exudate.  Eyes:     General: No scleral icterus.       Right eye: No discharge.        Left eye: No discharge.     Conjunctiva/sclera: Conjunctivae normal.     Pupils: Pupils are equal, round, and reactive to light.  Neck:     Thyroid: No thyromegaly.     Vascular: No carotid bruit.  Cardiovascular:     Rate and Rhythm: Normal rate and regular rhythm.     Pulses: Normal pulses.     Heart sounds: Normal heart sounds. No murmur heard.    No friction rub. No gallop.  Pulmonary:     Effort: Pulmonary effort is normal. No respiratory distress.     Breath sounds: Normal breath sounds. No wheezing  or rales.  Abdominal:     General: Abdomen is flat. Bowel sounds are normal. There is no distension.     Palpations: Abdomen is soft. There is no mass.     Tenderness: There is no abdominal tenderness. There is no right CVA tenderness, left CVA tenderness, guarding or rebound.     Hernia: No hernia is present.  Musculoskeletal:        General: No swelling, tenderness, deformity or signs of injury. Normal range of motion.     Cervical back: Normal range of motion and neck supple. No rigidity or tenderness.     Right lower leg: No edema.     Left lower leg:  No edema.  Lymphadenopathy:     Cervical: No cervical adenopathy.  Skin:    General: Skin is warm and dry.     Coloration: Skin is not jaundiced or pale.     Findings: No bruising, erythema, lesion or rash.  Neurological:     Mental Status: She is alert and oriented to person, place, and time. Mental status is at baseline.     Gait: Gait normal.  Psychiatric:        Mood and Affect: Mood normal.        Behavior: Behavior normal.        Thought Content: Thought content normal.        Judgment: Judgment normal.      No results found for any visits on 08/01/24.  Assessment & Plan    Routine Health Maintenance and Physical Exam  Exercise Activities and Dietary recommendations  Goals   None     Immunization History  Administered Date(s) Administered   Influenza Split 10/30/2007, 08/10/2012   Influenza, Seasonal, Injecte, Preservative Fre 09/07/2023   Influenza,inj,Quad PF,6+ Mos 10/14/2021   Influenza-Unspecified 09/05/2018   Pneumococcal Polysaccharide-23 09/26/2012   Tdap 03/25/2022   Zoster Recombinant(Shingrix ) 03/25/2022, 06/04/2022    Health Maintenance  Topic Date Due   Colon Cancer Screening  Never done   Pneumococcal Vaccine for age over 18 (2 of 2 - PCV) 09/26/2013   Flu Shot  07/06/2024   COVID-19 Vaccine (1) 08/17/2024*   Mammogram  06/29/2026   Pap with HPV screening  03/26/2027   DTaP/Tdap/Td vaccine (2 - Td or Tdap) 03/25/2032   Hepatitis C Screening  Completed   HIV Screening  Completed   Zoster (Shingles) Vaccine  Completed   Hepatitis B Vaccine  Aged Out   HPV Vaccine  Aged Out   Meningitis B Vaccine  Aged Out  *Topic was postponed. The date shown is not the original due date.    Discussed health benefits of physical activity, and encouraged her to engage in regular exercise appropriate for her age and condition.  Assessment & Plan Hypertension Chronic Hypertension not well-controlled. Home readings range 117/70 to 135/71; in-office 165/70.  Stress and nocturnal palpitations may contribute. - Increase losartan  to 1.5 tablets if BP >140/90. - Advise evening dosing if BP higher then. - Bring BP records and device for calibration check next visit. - Follow-up in one month to reassess BP and consider pneumonia vaccination if controlled. Will follow-up   Annual physical exam (Primary) Well adult visit with abnormal findings Things to do to keep yourself healthy  - Exercise at least 30-45 minutes a day, 3-4 days a week.  - Eat a low-fat diet with lots of fruits and vegetables, up to 7-9 servings per day.  - Seatbelts can save your life. Wear them always.  -  Smoke detectors on every level of your home, check batteries every year.  - Eye Doctor - have an eye exam every 1-2 years  - Safe sex - if you may be exposed to STDs, use a condom.  - Alcohol -  If you drink, do it moderately, less than 2 drinks per day.  - Health Care Power of Attorney. Choose someone to speak for you if you are not able.  - Depression is common in our stressful world.If you're feeling down or losing interest in things you normally enjoy, please come in for a visit.  - Violence - If anyone is threatening or hurting you, please call immediately.  No follow-ups on file.    The patient was advised to call back or seek an in-person evaluation if the symptoms worsen or if the condition fails to improve as anticipated.  I discussed the assessment and treatment plan with the patient. The patient was provided an opportunity to ask questions and all were answered. The patient agreed with the plan and demonstrated an understanding of the instructions.  I, Janelle Culton, PA-C have reviewed all documentation for this visit. The documentation on 08/01/2024  for the exam, diagnosis, procedures, and orders are all accurate and complete.  Jolynn Spencer, [facility], MMS [facility] [phone] (phone) [phone] (fax)  [facility] "

## 2024-08-13 ENCOUNTER — Encounter
Admission: RE | Disposition: A | Discharge status: Home / Self Care | Payer: Self-pay | Source: Home / Self Care | Attending: Gastroenterology

## 2024-08-13 ENCOUNTER — Ambulatory Visit: Admitting: Anesthesiology

## 2024-08-13 ENCOUNTER — Ambulatory Visit
Admission: RE | Admit: 2024-08-13 | Discharge: 2024-08-13 | Disposition: A | Discharge status: Home / Self Care | Attending: Gastroenterology | Admitting: Gastroenterology

## 2024-08-13 ENCOUNTER — Encounter: Payer: Self-pay | Admitting: Gastroenterology

## 2024-08-13 DIAGNOSIS — Z1211 Encounter for screening for malignant neoplasm of colon: Secondary | ICD-10-CM | POA: Insufficient documentation

## 2024-08-13 DIAGNOSIS — K640 First degree hemorrhoids: Secondary | ICD-10-CM | POA: Insufficient documentation

## 2024-08-13 DIAGNOSIS — J45909 Unspecified asthma, uncomplicated: Secondary | ICD-10-CM | POA: Diagnosis not present

## 2024-08-13 DIAGNOSIS — D125 Benign neoplasm of sigmoid colon: Secondary | ICD-10-CM | POA: Insufficient documentation

## 2024-08-13 DIAGNOSIS — I10 Essential (primary) hypertension: Secondary | ICD-10-CM | POA: Diagnosis not present

## 2024-08-13 DIAGNOSIS — Z87891 Personal history of nicotine dependence: Secondary | ICD-10-CM | POA: Diagnosis not present

## 2024-08-13 DIAGNOSIS — K5730 Diverticulosis of large intestine without perforation or abscess without bleeding: Secondary | ICD-10-CM | POA: Insufficient documentation

## 2024-08-13 DIAGNOSIS — K635 Polyp of colon: Secondary | ICD-10-CM | POA: Diagnosis not present

## 2024-08-13 DIAGNOSIS — K649 Unspecified hemorrhoids: Secondary | ICD-10-CM | POA: Diagnosis not present

## 2024-08-13 HISTORY — PX: POLYPECTOMY: SHX149

## 2024-08-13 HISTORY — PX: COLONOSCOPY: SHX5424

## 2024-08-13 SURGERY — COLONOSCOPY
Anesthesia: General

## 2024-08-13 MED ORDER — SODIUM CHLORIDE 0.9 % IV SOLN: INTRAVENOUS | Status: DC

## 2024-08-13 MED ORDER — PROPOFOL 500 MG/50ML IV EMUL
INTRAVENOUS | Status: DC | PRN
  Administered 2024-08-13: 140 ug/kg/min via INTRAVENOUS

## 2024-08-13 MED ORDER — PROPOFOL 10 MG/ML IV BOLUS
INTRAVENOUS | Status: DC | PRN
  Administered 2024-08-13: 60 mg via INTRAVENOUS
  Administered 2024-08-13: 40 mg via INTRAVENOUS

## 2024-08-13 NOTE — Interval H&P Note (Signed)
 History and Physical Interval Note: Preprocedure H&P from 08/13/24  was reviewed and there was no interval change after seeing and examining the patient.  Written consent was obtained from the patient after discussion of risks, benefits, and alternatives. Patient has consented to proceed with Colonoscopy with possible intervention   08/13/2024 10:12 AM  Terri Cruz  has presented today for surgery, with the diagnosis of R19.5 (ICD-10-CM) - Positive colorectal cancer screening using Cologuard test.  The various methods of treatment have been discussed with the patient and family. After consideration of risks, benefits and other options for treatment, the patient has consented to  Procedure(s): COLONOSCOPY (N/A) as a surgical intervention.  The patient's history has been reviewed, patient examined, no change in status, stable for surgery.  I have reviewed the patient's chart and labs.  Questions were answered to the patient's satisfaction.     Elspeth Ozell Jungling

## 2024-08-13 NOTE — Op Note (Signed)
 [facility] Patient Name: Terri Cruz Procedure Date: 08/13/2024 10:08 AM MRN: [id number] Account #: [id number] Date of Birth: Feb 09, 1961 Admit Type: Outpatient Age: 63 Room: [facility] ENDO ROOM 2 Gender: Female Note Status: Finalized Instrument Name: Peds Colonoscope [id number] Procedure:             Colonoscopy Indications:           Positive Cologuard test Providers:             Elspeth Ozell Jungling DO, DO Medicines:             Monitored Anesthesia Care Complications:         No immediate complications. Estimated blood loss:                         Minimal. Procedure:             Pre-Anesthesia Assessment:                        - Prior to the procedure, a History and Physical was                         performed, and patient medications and allergies were                         reviewed. The patient is competent. The risks and                         benefits of the procedure and the sedation options and                         risks were discussed with the patient. All questions                         were answered and informed consent was obtained.                         Patient identification and proposed procedure were                         verified by the physician, the nurse, the anesthetist                         and the technician in the endoscopy suite. Mental                         Status Examination: alert and oriented. Airway                         Examination: normal oropharyngeal airway and neck                         mobility. Respiratory Examination: clear to                         auscultation. CV Examination: RRR, no murmurs, no S3                         or S4. Prophylactic Antibiotics: The patient does not  require prophylactic antibiotics. Prior                         Anticoagulants: The patient has taken no anticoagulant                         or antiplatelet agents. ASA Grade Assessment: II  - A                         patient with mild systemic disease. After reviewing                         the risks and benefits, the patient was deemed in                         satisfactory condition to undergo the procedure. The                         anesthesia plan was to use monitored anesthesia care                         (MAC). Immediately prior to administration of                         medications, the patient was re-assessed for adequacy                         to receive sedatives. The heart rate, respiratory                         rate, oxygen saturations, blood pressure, adequacy of                         pulmonary ventilation, and response to care were                         monitored throughout the procedure. The physical                         status of the patient was re-assessed after the                         procedure.                        After obtaining informed consent, the colonoscope was                         passed under direct vision. Throughout the procedure,                         the patient's blood pressure, pulse, and oxygen                         saturations were monitored continuously. The                         Colonoscope was introduced through the anus and  advanced to the the terminal ileum, with                         identification of the appendiceal orifice and IC                         valve. The colonoscopy was performed without                         difficulty. The patient tolerated the procedure well.                         The quality of the bowel preparation was evaluated                         using the BBPS [facility] Bowel Preparation Scale) with                         scores of: Right Colon = 3, Transverse Colon = 3 and                         Left Colon = 3 (entire mucosa seen well with no                         residual staining, small fragments of stool or opaque                          liquid). The total BBPS score equals 9. The terminal                         ileum, ileocecal valve, appendiceal orifice, and                         rectum were photographed. Findings:      The perianal and digital rectal examinations were normal. Pertinent       negatives include normal sphincter tone.      The terminal ileum appeared normal. Estimated blood loss: none.      Retroflexion in the right colon was performed.      Multiple small-mouthed diverticula were found in the sigmoid colon.       Estimated blood loss: none.      Non-bleeding internal hemorrhoids were found during retroflexion. The       hemorrhoids were Grade I (internal hemorrhoids that do not prolapse).       Estimated blood loss: none.      A 1 to 2 mm polyp was found in the sigmoid colon. The polyp was sessile.       The polyp was removed with a jumbo cold forceps. Resection and retrieval       were complete. Estimated blood loss was minimal.      The exam was otherwise without abnormality on direct and retroflexion       views. Impression:            - The examined portion of the ileum was normal.                        - Diverticulosis in the sigmoid colon.                        -  Non-bleeding internal hemorrhoids.                        - One 1 to 2 mm polyp in the sigmoid colon, removed                         with a jumbo cold forceps. Resected and retrieved.                        - The examination was otherwise normal on direct and                         retroflexion views. Recommendation:        - Patient has a contact number available for                         emergencies. The signs and symptoms of potential                         delayed complications were discussed with the patient.                         Return to normal activities tomorrow. Written                         discharge instructions were provided to the patient.                        - Discharge patient to home.                         - Resume previous diet.                        - Continue present medications.                        - Await pathology results.                        - Repeat colonoscopy for surveillance based on                         pathology results.                        - Return to referring physician as previously                         scheduled.                        - The findings and recommendations were discussed with                         the patient. Procedure Code(s):     --- Professional ---                        [id number], Colonoscopy, flexible; with biopsy, single or  multiple Diagnosis Code(s):     --- Professional ---                        K64.0, First degree hemorrhoids                        D12.5, Benign neoplasm of sigmoid colon                        R19.5, Other fecal abnormalities                        K57.30, Diverticulosis of large intestine without                         perforation or abscess without bleeding CPT copyright 2022 American Medical Association. All rights reserved. The codes documented in this report are preliminary and upon coder review may  be revised to meet current compliance requirements. Attending Participation:      I personally performed the entire procedure. Elspeth Jungling, DO Elspeth Ozell Jungling DO, DO 08/13/2024 10:45:52 AM This report has been signed electronically. Number of Addenda: 0 Note Initiated On: 08/13/2024 10:08 AM Scope Withdrawal Time: 0 hours 13 minutes 24 seconds  Total Procedure Duration: 0 hours 16 minutes 10 seconds  Estimated Blood Loss:  Estimated blood loss was minimal.      [facility]

## 2024-08-13 NOTE — H&P (Signed)
 Pre-Procedure H&P   Patient ID: Terri Cruz is a 63 y.o. female.  Gastroenterology Provider: Elspeth Ozell Jungling, DO  Referring Provider: Jolynn Spencer, PA PCP: Ostwalt, Janna, PA-C  Date: 08/13/2024  HPI Ms. Terri Cruz is a 63 y.o. female who presents today for Colonoscopy for positive Cologuard .  Positive Cologuard August 7.  No family history colon cancer colon polyps.  No previous colonoscopy 1-2 bowel movements daily without melena or hematochezia.  Hemoglobin 11.3 MCV 93    Past Medical History:  Diagnosis Date   Allergy 2013   Arthritis    Asthma 2013   Cataract    Heart murmur as a young adult   Hypertension 2025    Past Surgical History:  Procedure Laterality Date   TUBAL LIGATION      Family History No h/o GI disease or malignancy  Review of Systems  Constitutional:  Negative for activity change, appetite change, chills, diaphoresis, fatigue, fever and unexpected weight change.  HENT:  Negative for trouble swallowing and voice change.   Respiratory:  Negative for shortness of breath and wheezing.   Cardiovascular:  Negative for chest pain, palpitations and leg swelling.  Gastrointestinal:  Negative for abdominal distention, abdominal pain, anal bleeding, blood in stool, constipation, diarrhea, nausea, rectal pain and vomiting.  Musculoskeletal:  Negative for arthralgias and myalgias.  Skin:  Negative for color change and pallor.  Neurological:  Negative for dizziness, syncope and weakness.  Psychiatric/Behavioral:  Negative for confusion.   All other systems reviewed and are negative.    Medications No current facility-administered medications on file prior to encounter.   Current Outpatient Medications on File Prior to Encounter  Medication Sig Dispense Refill   losartan  (COZAAR ) 25 MG tablet Take 1 tablet (25 mg total) by mouth daily. 90 tablet 1   meloxicam  (MOBIC ) 15 MG tablet TAKE 1 TABLET BY MOUTH ONCE DAILY 30 tablet 0   albuterol   (VENTOLIN  HFA) 108 (90 Base) MCG/ACT inhaler INHALE 2 PUFFS BY MOUTH EVERY 4 HOURS AS NEEDED FOR WHEEZING FOR SHORTNESS OF BREATH (Patient taking differently: every 4 (four) hours as needed. INHALE 2 PUFFS BY MOUTH EVERY 4 HOURS AS NEEDED FOR WHEEZING FOR SHORTNESS OF BREATH) 18 g 3   Budeson-Glycopyrrol-Formoterol  (BREZTRI  AEROSPHERE) 160-9-4.8 MCG/ACT AERO Inhale 2 puffs into the lungs in the morning and at bedtime. 10.7 g 11   Budeson-Glycopyrrol-Formoterol  (BREZTRI  AEROSPHERE) 160-9-4.8 MCG/ACT AERO Inhale 2 puffs into the lungs in the morning and at bedtime. 5.9 g 0   Spacer/Aero-Holding Chambers (OPTICHAMBER DIAMOND ) MISC Attach to inhaler (Patient taking differently: as needed. Attach to inhaler) 1 each 1    Pertinent medications related to GI and procedure were reviewed by me with the patient prior to the procedure   Current Facility-Administered Medications:    0.9 %  sodium chloride  infusion, , Intravenous, Continuous, Jungling Elspeth Ozell, DO, Last Rate: 20 mL/hr at 08/13/24 0924, New Bag at 08/13/24 0924  sodium chloride  20 mL/hr at 08/13/24 9075       Allergies  Allergen Reactions   Codeine     headache   Hydrocodone Other (See Comments)    Gi issues   Hydrocodone Bit-Homatrop Mbr     Gi issues   Allergies were reviewed by me prior to the procedure  Objective   Body mass index is 25.24 kg/m. Vitals:   08/13/24 0921  BP: (!) 146/66  Pulse: 68  Resp: 18  Temp: 98.2 F (36.8 C)  TempSrc: Oral  Weight:  70.9 kg  Height: 5' 6 (1.676 m)     Physical Exam Vitals and nursing note reviewed.  Constitutional:      General: She is not in acute distress.    Appearance: Normal appearance. She is not ill-appearing, toxic-appearing or diaphoretic.  HENT:     Head: Normocephalic and atraumatic.     Nose: Nose normal.     Mouth/Throat:     Mouth: Mucous membranes are moist.     Pharynx: Oropharynx is clear.  Eyes:     General: No scleral icterus.    Extraocular  Movements: Extraocular movements intact.  Cardiovascular:     Rate and Rhythm: Normal rate and regular rhythm.     Heart sounds: Normal heart sounds. No murmur heard.    No friction rub. No gallop.  Pulmonary:     Effort: Pulmonary effort is normal. No respiratory distress.     Breath sounds: Normal breath sounds. No wheezing, rhonchi or rales.  Abdominal:     General: Bowel sounds are normal. There is no distension.     Palpations: Abdomen is soft.     Tenderness: There is no abdominal tenderness. There is no guarding or rebound.  Musculoskeletal:     Cervical back: Neck supple.     Right lower leg: No edema.     Left lower leg: No edema.  Skin:    General: Skin is warm and dry.     Coloration: Skin is not jaundiced or pale.  Neurological:     General: No focal deficit present.     Mental Status: She is alert and oriented to person, place, and time. Mental status is at baseline.  Psychiatric:        Mood and Affect: Mood normal.        Behavior: Behavior normal.        Thought Content: Thought content normal.        Judgment: Judgment normal.      Assessment:  Ms. Terri Cruz is a 63 y.o. female  who presents today for Colonoscopy for positive Cologuard .  Plan:  Colonoscopy with possible intervention today  Colonoscopy with possible biopsy, control of bleeding, polypectomy, and interventions as necessary has been discussed with the patient/patient representative. Informed consent was obtained from the patient/patient representative after explaining the indication, nature, and risks of the procedure including but not limited to death, bleeding, perforation, missed neoplasm/lesions, cardiorespiratory compromise, and reaction to medications. Opportunity for questions was given and appropriate answers were provided. Patient/patient representative has verbalized understanding is amenable to undergoing the procedure.   Elspeth Ozell Jungling, DO  [facility]  Gastroenterology  Portions of the record may have been created with voice recognition software. Occasional wrong-word or 'sound-a-like' substitutions may have occurred due to the inherent limitations of voice recognition software.  Read the chart carefully and recognize, using context, where substitutions may have occurred.

## 2024-08-13 NOTE — Transfer of Care (Signed)
 Immediate Anesthesia Transfer of Care Note  Patient: Terri Cruz  Procedure(s) Performed: COLONOSCOPY POLYPECTOMY, INTESTINE  Patient Location: PACU  Anesthesia Type:General  Level of Consciousness: awake, alert , and oriented  Airway & Oxygen Therapy: Patient Spontanous Breathing  Post-op Assessment: Report given to RN and Post -op Vital signs reviewed and stable  Post vital signs: Reviewed and stable  Last Vitals:  Vitals Value Taken Time  BP 117/62 08/13/24 10:47  Temp 35.8 C 08/13/24 10:47  Pulse 83 08/13/24 10:54  Resp 18 08/13/24 10:54  SpO2 100 % 08/13/24 10:54  Vitals shown include unfiled device data.  Last Pain:  Vitals:   08/13/24 1047  TempSrc: Temporal  PainSc: 0-No pain         Complications: No notable events documented.

## 2024-08-13 NOTE — Anesthesia Postprocedure Evaluation (Signed)
 Anesthesia Post Note  Patient: Terri Cruz  Procedure(s) Performed: COLONOSCOPY POLYPECTOMY, INTESTINE  Patient location during evaluation: Endoscopy Anesthesia Type: General Level of consciousness: awake and alert Pain management: pain level controlled Vital Signs Assessment: post-procedure vital signs reviewed and stable Respiratory status: spontaneous breathing, nonlabored ventilation, respiratory function stable and patient connected to nasal cannula oxygen Cardiovascular status: blood pressure returned to baseline and stable Postop Assessment: no apparent nausea or vomiting Anesthetic complications: no   No notable events documented.   Last Vitals:  Vitals:   08/13/24 1057 08/13/24 1107  BP: (!) 130/59 128/60  Pulse: 68   Resp: 16 14  Temp:    SpO2: 99%     Last Pain:  Vitals:   08/13/24 1047  TempSrc: Temporal  PainSc: 0-No pain                 Lendia LITTIE Mae

## 2024-08-13 NOTE — Anesthesia Preprocedure Evaluation (Addendum)
 Anesthesia Evaluation  Patient identified by MRN, date of birth, ID band Patient awake    Reviewed: Allergy & Precautions, NPO status , Patient's Chart, lab work & pertinent test results  History of Anesthesia Complications Negative for: history of anesthetic complications  Airway Mallampati: III  TM Distance: >3 FB Neck ROM: full    Dental no notable dental hx.    Pulmonary asthma , former smoker   Pulmonary exam normal        Cardiovascular hypertension, On Medications Normal cardiovascular exam     Neuro/Psych negative neurological ROS  negative psych ROS   GI/Hepatic negative GI ROS, Neg liver ROS,,,  Endo/Other  negative endocrine ROS    Renal/GU negative Renal ROS  negative genitourinary   Musculoskeletal   Abdominal   Peds  Hematology negative hematology ROS (+)   Anesthesia Other Findings Past Medical History: 2013: Allergy No date: Arthritis 2013: Asthma No date: Cataract as a young adult: Heart murmur 2025: Hypertension  Past Surgical History: No date: TUBAL LIGATION     Reproductive/Obstetrics negative OB ROS                              Anesthesia Physical Anesthesia Plan  ASA: 2  Anesthesia Plan: General   Post-op Pain Management: Minimal or no pain anticipated   Induction: Intravenous  PONV Risk Score and Plan: 1 and Propofol  infusion and TIVA  Airway Management Planned: Natural Airway and Nasal Cannula  Additional Equipment:   Intra-op Plan:   Post-operative Plan:   Informed Consent: I have reviewed the patients History and Physical, chart, labs and discussed the procedure including the risks, benefits and alternatives for the proposed anesthesia with the patient or authorized representative who has indicated his/her understanding and acceptance.     Dental Advisory Given  Plan Discussed with: Anesthesiologist, CRNA and Surgeon  Anesthesia  Plan Comments: (Patient consented for risks of anesthesia including but not limited to:  - adverse reactions to medications - risk of airway placement if required - damage to eyes, teeth, lips or other oral mucosa - nerve damage due to positioning  - sore throat or hoarseness - Damage to heart, brain, nerves, lungs, other parts of body or loss of life  Patient voiced understanding and assent.)         Anesthesia Quick Evaluation

## 2024-08-14 ENCOUNTER — Encounter: Payer: Self-pay | Admitting: Gastroenterology

## 2024-08-14 NOTE — Addendum Note (Signed)
 Addendum  created 08/14/24 1540 by Chesley Lendia CROME, MD   Intraprocedure Event edited, Intraprocedure Staff edited

## 2024-08-15 ENCOUNTER — Ambulatory Visit: Payer: Self-pay | Admitting: Physician Assistant

## 2024-08-15 LAB — SURGICAL PATHOLOGY

## 2024-08-29 ENCOUNTER — Encounter: Payer: Self-pay | Admitting: Physician Assistant

## 2024-08-29 ENCOUNTER — Ambulatory Visit (INDEPENDENT_AMBULATORY_CARE_PROVIDER_SITE_OTHER): Admitting: Physician Assistant

## 2024-08-29 VITALS — BP 132/54 | HR 61 | Resp 14 | Ht 66.0 in | Wt 158.7 lb

## 2024-08-29 DIAGNOSIS — I10 Essential (primary) hypertension: Secondary | ICD-10-CM

## 2024-08-29 DIAGNOSIS — Z23 Encounter for immunization: Secondary | ICD-10-CM | POA: Diagnosis not present

## 2024-08-29 DIAGNOSIS — L719 Rosacea, unspecified: Secondary | ICD-10-CM | POA: Diagnosis not present

## 2024-08-29 DIAGNOSIS — K648 Other hemorrhoids: Secondary | ICD-10-CM | POA: Diagnosis not present

## 2024-08-29 DIAGNOSIS — K5730 Diverticulosis of large intestine without perforation or abscess without bleeding: Secondary | ICD-10-CM

## 2024-08-29 NOTE — Progress Notes (Signed)
 Established patient visit  Patient: Terri Cruz   DOB: 1961/03/13   63 y.o. Female  MRN: [id number] Visit Date: 08/29/2024  Today's healthcare provider: Jolynn Spencer, PA-C   Chief Complaint  Patient presents with   Hypertension    HTN- readings at home has been good.   Subjective     HPI     Hypertension    Additional comments: HTN- readings at home has been good.      Last edited by Wilfred Hargis RAMAN, CMA on 08/29/2024  2:08 PM.       Discussed the use of AI scribe software for clinical note transcription with the patient, who gave verbal consent to proceed.  History of Present Illness Terri Cruz is a 63 year old female with hypertension who presents for blood pressure management.  Her home blood pressure readings range from 117/70 to 135/71, while in-office readings have been higher, with a recent measurement of 165/70. Blood pressure is lower on weekends, which she attributes to work-related stress. She increased her losartan  dosage to 1.5 tablets since July 31, 2024, and has observed a decrease in blood pressure. She questions whether her morning inhaler use could affect her blood pressure readings. No headaches or symptoms when blood pressure is elevated.  She uses a new blood pressure machine since June 05, 2024, and has experienced issues with it squeezing her arm too tightly, causing bruising. She typically measures her blood pressure on her left arm at home.  She experiences facial flushing, attributed to rosacea, and uses metrogel for treatment. She recently received a flu shot and questions whether it could affect her blood pressure.      08/01/2024    3:11 PM 06/05/2024    1:34 PM 03/25/2022    3:41 PM  Depression screen PHQ 2/9  Decreased Interest 0 0 0  Down, Depressed, Hopeless 0 0 1  PHQ - 2 Score 0 0 1  Altered sleeping 0 1 2  Tired, decreased energy 0 0 0  Change in appetite 0 0 0  Feeling bad or failure about yourself  0 0 0  Trouble  concentrating 0 0 0  Moving slowly or fidgety/restless 0 0 0  Suicidal thoughts 0 0 0  PHQ-9 Score 0 1 3  Difficult doing work/chores  Not difficult at all Not difficult at all      08/01/2024    3:11 PM 06/05/2024    1:35 PM  GAD 7 : Generalized Anxiety Score  Nervous, Anxious, on Edge 0 0  Control/stop worrying 0 0  Worry too much - different things 0 0  Trouble relaxing 0 0  Restless 0 0  Easily annoyed or irritable 1 0  Afraid - awful might happen 0 0  Total GAD 7 Score 1 0  Anxiety Difficulty Not difficult at all Not difficult at all    Medications: Outpatient Medications Prior to Visit  Medication Sig   albuterol  (VENTOLIN  HFA) 108 (90 Base) MCG/ACT inhaler INHALE 2 PUFFS BY MOUTH EVERY 4 HOURS AS NEEDED FOR WHEEZING FOR SHORTNESS OF BREATH (Patient taking differently: every 4 (four) hours as needed. INHALE 2 PUFFS BY MOUTH EVERY 4 HOURS AS NEEDED FOR WHEEZING FOR SHORTNESS OF BREATH)   Budeson-Glycopyrrol-Formoterol  (BREZTRI  AEROSPHERE) 160-9-4.8 MCG/ACT AERO Inhale 2 puffs into the lungs in the morning and at bedtime.   Budeson-Glycopyrrol-Formoterol  (BREZTRI  AEROSPHERE) 160-9-4.8 MCG/ACT AERO Inhale 2 puffs into the lungs in the morning and at bedtime.   losartan  (COZAAR ) 25 MG tablet  Take 1 tablet (25 mg total) by mouth daily.   meloxicam  (MOBIC ) 15 MG tablet TAKE 1 TABLET BY MOUTH ONCE DAILY   Spacer/Aero-Holding Chambers (OPTICHAMBER DIAMOND ) MISC Attach to inhaler (Patient taking differently: as needed. Attach to inhaler)   No facility-administered medications prior to visit.    Review of Systems All negative Except see HPI       Objective    BP (!) 132/54   Pulse 61   Resp 14   Ht 5' 6 (1.676 m)   Wt 158 lb 11.2 oz (72 kg)   LMP 02/11/2015   SpO2 100%   BMI 25.61 kg/m     Physical Exam Vitals reviewed.  Constitutional:      General: She is not in acute distress.    Appearance: Normal appearance. She is well-developed. She is not diaphoretic.   HENT:     Head: Normocephalic and atraumatic.  Eyes:     General: No scleral icterus.    Conjunctiva/sclera: Conjunctivae normal.  Neck:     Thyroid: No thyromegaly.  Cardiovascular:     Rate and Rhythm: Normal rate and regular rhythm.     Pulses: Normal pulses.     Heart sounds: Normal heart sounds. No murmur heard. Pulmonary:     Effort: Pulmonary effort is normal. No respiratory distress.     Breath sounds: Normal breath sounds. No wheezing, rhonchi or rales.  Musculoskeletal:     Cervical back: Neck supple.     Right lower leg: No edema.     Left lower leg: No edema.  Lymphadenopathy:     Cervical: No cervical adenopathy.  Skin:    General: Skin is warm and dry.     Findings: No rash.  Neurological:     Mental Status: She is alert and oriented to person, place, and time. Mental status is at baseline.  Psychiatric:        Mood and Affect: Mood normal.        Behavior: Behavior normal.      No results found for any visits on 08/29/24.      Assessment & Plan Hypertension Chronic and stable Blood pressure well-controlled at home, possible white coat hypertension in-office. Increased losartan  effective. Stress may elevate readings. - Continue losartan  at 1.5 tablets. - Encourage stress management techniques. - Monitor blood pressure at home, bring readings to appointments. - Reassess in two months.  Diverticulosis of colon Diagnosed during colonoscopy, asymptomatic. Educated on diverticulitis risk with poor diet management. - Await gastroenterologist's follow-up letter for colonoscopy interval. Advised high-fiber diet, regular physical activity, smoking, alcohol, red meat intake and use of NSAIDs /except asa  Internal hemorrhoids Identified during colonoscopy, no active bleeding. - Monitor for bleeding. Avoid constipation  Pt was advised: -Eliminate medications that cause constipation. -Increase fluid intake. -Increase soluble fiber (25-30g/day) in  diet. -Encourage regular defecation attempts after eating. -Regular exercise -Enemas if other methods fail- Bulking agents (accompanied by adequate fluids)  Psyllium  (Konsyl, Metamucil, Perdiem Fiber): 1 tbsp (approx 3.5 grams fiber) in 8-oz liquid PO daily up to TID  Rosacea Facial flushing noted, uses metronidazole, declined further treatment. Managed by dermatology PRN  Immunization due (Primary)  - Flu vaccine trivalent PF, 6mos and older(Flulaval,Afluria,Fluarix,Fluzone)  Orders Placed This Encounter  Procedures   Flu vaccine trivalent PF, 6mos and older(Flulaval,Afluria,Fluarix,Fluzone)    No follow-ups on file.   The patient was advised to call back or seek an in-person evaluation if the symptoms worsen or if the condition fails  to improve as anticipated.  I discussed the assessment and treatment plan with the patient. The patient was provided an opportunity to ask questions and all were answered. The patient agreed with the plan and demonstrated an understanding of the instructions.  I, Felicidad Sugarman, PA-C have reviewed all documentation for this visit. The documentation on 08/29/2024  for the exam, diagnosis, procedures, and orders are all accurate and complete.  Jolynn Spencer, [facility], MMS [facility] [phone] (phone) [phone] (fax)  [facility]

## 2024-10-30 ENCOUNTER — Other Ambulatory Visit: Payer: Self-pay | Admitting: Physician Assistant

## 2024-10-30 DIAGNOSIS — I10 Essential (primary) hypertension: Secondary | ICD-10-CM

## 2024-10-31 ENCOUNTER — Ambulatory Visit: Admitting: Physician Assistant

## 2024-11-20 ENCOUNTER — Ambulatory Visit: Admitting: Pulmonary Disease

## 2024-11-20 ENCOUNTER — Encounter: Payer: Self-pay | Admitting: Pulmonary Disease

## 2024-11-20 VITALS — BP 136/76 | HR 71 | Temp 97.7°F | Ht 66.0 in | Wt 162.6 lb

## 2024-11-20 DIAGNOSIS — R0602 Shortness of breath: Secondary | ICD-10-CM

## 2024-11-20 DIAGNOSIS — Z87891 Personal history of nicotine dependence: Secondary | ICD-10-CM | POA: Diagnosis not present

## 2024-11-20 DIAGNOSIS — J4489 Other specified chronic obstructive pulmonary disease: Secondary | ICD-10-CM | POA: Diagnosis not present

## 2024-11-20 MED ORDER — BREZTRI AEROSPHERE 160-9-4.8 MCG/ACT IN AERO: 2.0000 | INHALATION_SPRAY | Freq: Two times a day (BID) | RESPIRATORY_TRACT | 11 refills | Status: AC

## 2024-11-20 NOTE — Patient Instructions (Signed)
 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 She is doing well, has not experienced shortness of breath recently, and has not needed to use her rescue inhaler in over a year. 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 She confirms that she is not smoking.  YOUR PLAN:  -ASTHMA-COPD OVERLAP SYNDROME: Asthma-COPD overlap syndrome is a condition where features of both asthma and chronic obstructive pulmonary disease (COPD) are present. Your lung examination is clear, and you are a non-smoker. Your previous hereditary emphysema testing showed a carrier variant without significant clinical impact. We have scheduled a follow-up appointment in six months and will perform a lung function test at your next visit. Please report any issues or problems before the next appointment.  INSTRUCTIONS:  Please follow up in six months for your next appointment, where we will perform a lung function test. If you experience any issues or problems before then, contact us  immediately.

## 2024-11-20 NOTE — Progress Notes (Unsigned)
 Subjective:    Patient ID: Terri Cruz, female    DOB: [birth year], 63 y.o.   MRN: [id number]  Patient Care Team: Ostwalt, Janna, PA-C as PCP - General (Physician Assistant)  Chief Complaint  Patient presents with   Asthma    Occasional shortness of breath on exertion. No cough or wheezing. Using Breztri  daily. Has not needed albuterol .     BACKGROUND:63 year old former smoker, previously diagnosed with asthma initially seen by me in April 2023 with most recent follow-up by Madelin Stank, NP in August 2023 presents for follow-up visit.  She has been maintained on Symbicort  however, her insurance company no longer covers it.  Has noted increased shortness of breath since she ran out of her Symbicort  few days ago.    HPI  TEST/EVENTS :  PFTs May 17, 2022 showed moderate airflow obstruction with significant reversibility FEV1 was 80%, ratio 67, FVC 92%, positive bronchodilator response (30% change), DLCO 79%. Chest x-ray December 28, 2021 showed no acute process. Allergy panel April 2023  negative, IgE 95, absolute eosinophil count 300.  Alpha-1 antitrypsin phenotype October 11, 2023: Phenotype MS, level 109 mg/dL.  Review of Systems A 10 point review of systems was performed and it is as noted above otherwise negative.   Past Medical History:  Diagnosis Date   Allergy 2013   Arthritis    Asthma 2013   Cataract    Heart murmur as a young adult   Hypertension 2025    Past Surgical History:  Procedure Laterality Date   COLONOSCOPY N/A 08/13/2024   Procedure: COLONOSCOPY;  Surgeon: Onita Elspeth Sharper, DO;  Location: [facility] ENDOSCOPY;  Service: Gastroenterology;  Laterality: N/A;   POLYPECTOMY  08/13/2024   Procedure: POLYPECTOMY, INTESTINE;  Surgeon: Onita Elspeth Sharper, DO;  Location: [facility] ENDOSCOPY;  Service: Gastroenterology;;   TUBAL LIGATION      Patient Active Problem List   Diagnosis Date Noted   Rosacea 08/29/2024   Internal hemorrhoids 08/29/2024    Diverticulosis of sigmoid colon 08/29/2024   Immunization due 08/29/2024   Primary hypertension 06/20/2024   Elevated LDL cholesterol level 06/20/2024   Snoring 06/06/2024   Arthritis 06/06/2024   Other fatigue 06/05/2024   Asthma 08/02/2022   Annual physical exam 03/25/2022   Need for tetanus, diphtheria, and acellular pertussis (Tdap) vaccine in patient of adolescent age or older 03/25/2022   Elevated glucose 03/25/2022   Encounter for lipid screening for cardiovascular disease 03/25/2022   Need for shingles vaccine 03/25/2022   Cervical cancer screening 03/25/2022   Encounter for screening mammogram for malignant neoplasm of breast 01/27/2022   Moderate persistent asthma with exacerbation 12/28/2021   Cough due to bronchospasm 12/28/2021   Impingement syndrome of shoulder region 05/04/2018   Cardiac murmur 01/30/2016   Allergic rhinitis, seasonal 01/30/2016   Leiomyoma of uterus 01/30/2016    Family History  Problem Relation Age of Onset   Alcohol abuse Mother    Emphysema Mother    Leukemia Mother    Liver cancer Mother    Arthritis Mother    Hypertension Father    Stroke Father    Hearing loss Father    Obesity Father    Breast cancer Sister    Graves' disease Sister    Breast cancer Paternal Aunt     Social History   Tobacco Use   Smoking status: Former    Current packs/day: 0.00    Average packs/day: 0.5 packs/day for 20.0 years (10.0 ttl pk-yrs)    Types: Cigarettes  Start date: 12/06/1977    Quit date: 12/06/1997    Years since quitting: 26.9   Smokeless tobacco: Never  Substance Use Topics   Alcohol use: Yes    Alcohol/week: 3.0 - 6.0 standard drinks of alcohol    Types: 3 - 6 Cans of beer per week    Comment: occasionally    Allergies[1]  Active Medications[2]  Immunization History  Administered Date(s) Administered   Influenza Split 10/30/2007, 08/10/2012   Influenza, Seasonal, Injecte, Preservative Fre 09/07/2023, 08/29/2024    Influenza,inj,Quad PF,6+ Mos 10/14/2021   Influenza-Unspecified 09/05/2018   Pneumococcal Polysaccharide-23 09/26/2012   Tdap 03/25/2022   Zoster Recombinant(Shingrix ) 03/25/2022, 06/04/2022        Objective:     Vitals:   11/20/24 1534  BP: 136/76  Pulse: 71  Temp: 97.7 F (36.5 C)  Height: 5' 6 (1.676 m)  Weight: 162 lb 9.6 oz (73.8 kg)  SpO2: 97%  TempSrc: Temporal  BMI (Calculated): 26.26     SpO2: 97 %  GENERAL: Well-developed, well-nourished woman, no acute distress, fully ambulatory.  No conversational dyspnea. HEAD: Normocephalic, atraumatic.  EYES: Pupils equal, round, reactive to light.  No scleral icterus.  MOUTH: Dentition intact, oral mucosa moist. NECK: Supple. No thyromegaly. Trachea midline. No JVD.  No adenopathy. PULMONARY: Good air entry bilaterally.  No adventitious sounds. CARDIOVASCULAR: S1 and S2. Regular rate and rhythm.  No rubs, murmurs or gallops heard. ABDOMEN: Benign. MUSCULOSKELETAL: No joint deformity, no clubbing, no edema.  NEUROLOGIC: No overt focal deficit, no gait disturbance, speech is fluent. SKIN: Intact,warm,dry. PSYCH: Mood and behavior normal.        Assessment & Plan:     ICD-10-CM   1. Asthma-COPD overlap syndrome (HCC)  J44.89 Pulmonary function test    2. Shortness of breath  R06.02 Pulmonary function test      Orders Placed This Encounter  Procedures   Pulmonary function test    Standing Status:   Future    Expected Date:   05/21/2025    Expiration Date:   11/20/2025    Where should this test be performed?:   Outpatient Pulmonary    What type of PFT is being ordered?:   Full PFT    No orders of the defined types were placed in this encounter.    Advised if symptoms do not improve or worsen, to please contact office for sooner follow up or seek emergency care.    I spent xxx minutes of dedicated to the care of this patient on the date of this encounter to include pre-visit review of records,  face-to-face time with the patient discussing conditions above, post visit ordering of testing, clinical documentation with the electronic health record, making appropriate referrals as documented, and communicating necessary findings to members of the patients care team.   C. Leita Sanders, MD Advanced [facility]ch[facility]    *This note was dictated using voice recognition software/Dragon.  Despite best efforts to proofread, errors can occur which can change the meaning. Any transcriptional errors that result from this process are unintentional and may not be fully corrected at the time of dictation.    [1]  Allergies Allergen Reactions   Codeine     headache   Hydrocodone Other (See Comments)    Gi issues   Hydrocodone Bit-Homatrop Mbr     Gi issues  [2]  Current Meds  Medication Sig   albuterol  (VENTOLIN  HFA) 108 (90 Base) MCG/ACT inhaler INHALE 2 PUFFS BY MOUTH EVERY 4  HOURS AS NEEDED FOR WHEEZING FOR SHORTNESS OF BREATH   Budeson-Glycopyrrol-Formoterol  (BREZTRI  AEROSPHERE) 160-9-4.8 MCG/ACT AERO Inhale 2 puffs into the lungs in the morning and at bedtime.   losartan  (COZAAR ) 25 MG tablet Take 1 tablet by mouth once daily   meloxicam  (MOBIC ) 15 MG tablet TAKE 1 TABLET BY MOUTH ONCE DAILY

## 2024-12-05 DIAGNOSIS — M1611 Unilateral primary osteoarthritis, right hip: Secondary | ICD-10-CM | POA: Diagnosis not present

## 2024-12-05 DIAGNOSIS — M7061 Trochanteric bursitis, right hip: Secondary | ICD-10-CM | POA: Diagnosis not present

## 2024-12-05 DIAGNOSIS — S63042A Subluxation of carpometacarpal joint of left thumb, initial encounter: Secondary | ICD-10-CM | POA: Diagnosis not present

## 2024-12-05 DIAGNOSIS — M1812 Unilateral primary osteoarthritis of first carpometacarpal joint, left hand: Secondary | ICD-10-CM | POA: Diagnosis not present

## 2024-12-25 ENCOUNTER — Other Ambulatory Visit: Payer: Self-pay | Admitting: Physician Assistant

## 2024-12-25 DIAGNOSIS — N6489 Other specified disorders of breast: Secondary | ICD-10-CM

## 2025-01-15 ENCOUNTER — Other Ambulatory Visit

## 2025-01-15 ENCOUNTER — Encounter
